# Patient Record
Sex: Female | Born: 1985 | Race: Black or African American | Hispanic: No | Marital: Single | Smoking: Never smoker
Health system: Southern US, Community
[De-identification: ages and names within clinical notes are randomized; demographics above are authoritative.]

## PROBLEM LIST (undated history)

## (undated) ENCOUNTER — Inpatient Hospital Stay (HOSPITAL_COMMUNITY): Payer: Self-pay

## (undated) DIAGNOSIS — A159 Respiratory tuberculosis unspecified: Secondary | ICD-10-CM

## (undated) DIAGNOSIS — B009 Herpesviral infection, unspecified: Secondary | ICD-10-CM

## (undated) DIAGNOSIS — Z7251 High risk heterosexual behavior: Secondary | ICD-10-CM

## (undated) HISTORY — DX: High risk heterosexual behavior: Z72.51

## (undated) HISTORY — PX: THERAPEUTIC ABORTION: SHX798

---

## 2013-04-23 ENCOUNTER — Emergency Department (HOSPITAL_COMMUNITY)
Admission: EM | Admit: 2013-04-23 | Discharge: 2013-04-23 | Disposition: A | Discharge status: Home / Self Care | Payer: Medicaid Other | Attending: Emergency Medicine | Admitting: Emergency Medicine

## 2013-04-23 ENCOUNTER — Emergency Department (HOSPITAL_COMMUNITY): Payer: Medicaid Other

## 2013-04-23 ENCOUNTER — Encounter (HOSPITAL_COMMUNITY): Payer: Self-pay | Admitting: Emergency Medicine

## 2013-04-23 DIAGNOSIS — K5900 Constipation, unspecified: Secondary | ICD-10-CM | POA: Insufficient documentation

## 2013-04-23 DIAGNOSIS — R109 Unspecified abdominal pain: Secondary | ICD-10-CM | POA: Insufficient documentation

## 2013-04-23 DIAGNOSIS — Z3202 Encounter for pregnancy test, result negative: Secondary | ICD-10-CM | POA: Insufficient documentation

## 2013-04-23 DIAGNOSIS — D649 Anemia, unspecified: Secondary | ICD-10-CM

## 2013-04-23 DIAGNOSIS — R110 Nausea: Secondary | ICD-10-CM | POA: Insufficient documentation

## 2013-04-23 DIAGNOSIS — Z79899 Other long term (current) drug therapy: Secondary | ICD-10-CM | POA: Insufficient documentation

## 2013-04-23 LAB — URINALYSIS, ROUTINE W REFLEX MICROSCOPIC
Bilirubin Urine: NEGATIVE
Glucose, UA: NEGATIVE mg/dL
Hgb urine dipstick: NEGATIVE
Ketones, ur: NEGATIVE mg/dL
Leukocytes, UA: NEGATIVE
Nitrite: NEGATIVE
Protein, ur: NEGATIVE mg/dL
Specific Gravity, Urine: 1.034 — ABNORMAL HIGH (ref 1.005–1.030)
Urobilinogen, UA: 1 mg/dL (ref 0.0–1.0)
pH: 6 (ref 5.0–8.0)

## 2013-04-23 LAB — COMPREHENSIVE METABOLIC PANEL
ALT: 8 U/L (ref 0–35)
AST: 13 U/L (ref 0–37)
Albumin: 3.7 g/dL (ref 3.5–5.2)
Alkaline Phosphatase: 45 U/L (ref 39–117)
BUN: 9 mg/dL (ref 6–23)
CO2: 25 mEq/L (ref 19–32)
Calcium: 8.6 mg/dL (ref 8.4–10.5)
Chloride: 103 mEq/L (ref 96–112)
Creatinine, Ser: 0.69 mg/dL (ref 0.50–1.10)
GFR calc Af Amer: >90 mL/min (ref >90)
GFR calc non Af Amer: >90 mL/min (ref >90)
Glucose, Bld: 91 mg/dL (ref 70–99)
Potassium: 3.6 mEq/L (ref 3.5–5.1)
Sodium: 137 mEq/L (ref 135–145)
Total Bilirubin: 0.2 mg/dL — ABNORMAL LOW (ref 0.3–1.2)
Total Protein: 6.7 g/dL (ref 6.0–8.3)

## 2013-04-23 LAB — LIPASE, BLOOD: Lipase: 15 U/L (ref 11–59)

## 2013-04-23 LAB — CBC
HCT: 29 % — ABNORMAL LOW (ref 36.0–46.0)
Hemoglobin: 10 g/dL — ABNORMAL LOW (ref 12.0–15.0)
MCH: 27.3 pg (ref 26.0–34.0)
MCHC: 34.5 g/dL (ref 30.0–36.0)
MCV: 79.2 fL (ref 78.0–100.0)
Platelets: 248 10*3/uL (ref 150–400)
RBC: 3.66 MIL/uL — ABNORMAL LOW (ref 3.87–5.11)
RDW: 14.3 % (ref 11.5–15.5)
WBC: 5.9 10*3/uL (ref 4.0–10.5)

## 2013-04-23 MED ORDER — POLYETHYLENE GLYCOL 3350 17 GM/SCOOP PO POWD: 17.0000 g | Freq: Two times a day (BID) | ORAL | Status: DC

## 2013-04-23 MED ORDER — IOHEXOL 300 MG/ML  SOLN
100.0000 mL | Freq: Once | INTRAMUSCULAR | Status: AC | PRN
  Administered 2013-04-23: 100 mL via INTRAVENOUS

## 2013-04-23 MED ORDER — IOHEXOL 300 MG/ML  SOLN
25.0000 mL | INTRAMUSCULAR | Status: AC
  Administered 2013-04-23: 25 mL via ORAL

## 2013-04-23 MED ORDER — MORPHINE SULFATE 4 MG/ML IJ SOLN
4.0000 mg | Freq: Once | INTRAMUSCULAR | Status: AC
  Administered 2013-04-23: 4 mg via INTRAVENOUS
  Filled 2013-04-23: qty 1

## 2013-04-23 NOTE — ED Notes (Signed)
Pt still working on PO contrast

## 2013-04-23 NOTE — ED Provider Notes (Signed)
History    CSN: [id number] Arrival date & time 04/23/13  4098  First MD Initiated Contact with Patient 04/23/13 [phone]     Chief Complaint  Patient presents with  . Abdominal Pain   (Consider location/radiation/quality/duration/timing/severity/associated sxs/prior Treatment) HPI Pt is a 27yo female c/o RUQ and and right flank pain that started 2-3 weeks ago, constant, waxes and wanes, 8/10.  Nothing makes pain better or worse, has tried OTC "pain reliever" w/o relief.  Also reports mild nausea with pain. Reports hx of similar episode of pain last year but it went away on its own.  Was never seen by medical provider for last episode.  Denies fever, vomiting, diarrhea, dysuria, hematuria, vaginal discharge, or pain.  Denies trauma to area. Denies hx of abdominal surgeries.  LMP 04/05/13, nl per pt.    History reviewed. No pertinent past medical history. Past Surgical History  Procedure Laterality Date  . Therapeutic abortion      Pt states that she has had 2 abortions.    History reviewed. No pertinent family history. History  Substance Use Topics  . Smoking status: Never Smoker   . Smokeless tobacco: Not on file  . Alcohol Use: Yes     Comment: Occassionally.    OB History   Grav Para Term Preterm Abortions TAB SAB Ect Mult Living                 Review of Systems  Constitutional: Negative for fever and chills.  Gastrointestinal: Positive for nausea and abdominal pain. Negative for vomiting, diarrhea and constipation.  Genitourinary: Negative for dysuria, hematuria, decreased urine volume, vaginal bleeding, vaginal discharge, vaginal pain and menstrual problem.  All other systems reviewed and are negative.    Allergies  Review of patient's allergies indicates no known allergies.  Home Medications   Current Outpatient Rx  Name  Route  Sig  Dispense  Refill  . polyethylene glycol powder (GLYCOLAX/MIRALAX) powder   Oral   Take 17 g by mouth 2 (two) times daily. Until  daily soft stools  OTC   255 g   0    BP 104/69  Pulse 75  Temp(Src) 97.9 F (36.6 C) (Oral)  Resp 16  Ht 5\' 7"  (1.702 m)  Wt 155 lb (70.308 kg)  BMI 24.27 kg/m2  SpO2 100%  LMP 04/05/2013 Physical Exam  Nursing note and vitals reviewed. Constitutional: She appears well-developed and well-nourished. No distress.  Pt lying on exam bed, appears in mild discomfort holding right side. NAD.  HENT:  Head: Normocephalic and atraumatic.  Eyes: Conjunctivae are normal. No scleral icterus.  Neck: Normal range of motion.  Cardiovascular: Normal rate, regular rhythm and normal heart sounds.   Pulmonary/Chest: Effort normal and breath sounds normal. No respiratory distress. She has no wheezes. She has no rales. She exhibits no tenderness.  Abdominal: Soft. Bowel sounds are normal. She exhibits no distension and no mass. There is tenderness ( mild, RUQ, right flank). There is no rebound and no guarding.  Abd soft, non-distended, mild TTP RUQ and right flank.  Musculoskeletal: Normal range of motion.  Neurological: She is alert.  Skin: Skin is warm and dry. She is not diaphoretic.    ED Course  Procedures (including critical care time) Labs Reviewed  URINALYSIS, ROUTINE W REFLEX MICROSCOPIC - Abnormal; Notable for the following:    Specific Gravity, Urine 1.034 (*)    All other components within normal limits  COMPREHENSIVE METABOLIC PANEL - Abnormal; Notable for the following:  Total Bilirubin 0.2 (*)    All other components within normal limits  CBC - Abnormal; Notable for the following:    RBC 3.66 (*)    Hemoglobin 10.0 (*)    HCT 29.0 (*)    All other components within normal limits  LIPASE, BLOOD  POCT PREGNANCY, URINE   Ct Abdomen Pelvis W Contrast  04/23/2013   *RADIOLOGY REPORT*  Clinical Data: Right lower quadrant pain  CT ABDOMEN AND PELVIS WITH CONTRAST  Technique:  Multidetector CT imaging of the abdomen and pelvis was performed following the standard protocol  during bolus administration of intravenous contrast.  Contrast: OMNIPAQUE IOHEXOL 300 MG/ML  SOLN  Comparison: None.  Findings: Sagittal images of the spine are unremarkable. Lung bases are unremarkable. Liver, spleen, pancreas and adrenals are unremarkable.  No aortic aneurysm.  No small bowel obstruction.  No ascites or free air.  No adenopathy.  Kidneys are symmetrical in size and enhancement.  No hydronephrosis or hydroureter.  Moderate stool noted in the right colon and proximal transverse colon.  There is no pericecal inflammation.  Normal appendix is clearly visualized in axial image 53.  Normal appendix is confirmed in the sagittal image 50.  There is a low-lying cecum.  There is a question of hemorrhagic follicle within the right ovary measures 1 cm.  Retroflexed uterus is noted.  No adnexal mass.  Probable trace free fluid posterior cul-de-sac. Urinary bladder is unremarkable.  No distal colonic obstruction.  IMPRESSION:  1.  No pericecal inflammation.  Normal appendix is clearly visualized. 2.  Moderate stool noted in the right colon and proximal transverse colon without evidence of colonic obstruction. 3.  No hydronephrosis or hydroureter. 4.  Retroflexed uterus. 5.  There is a question of hemorrhagic follicle within right ovary measures 1 cm.   Original Report Authenticated By: Natasha Mead, M.D.   1. Abdominal pain   2. Constipation   3. Anemia     MDM  DDx: biliary cholic, cholelithiasis, cholecystitis, renal stone, appendicitis. Labs: cbc, cmp, lipase, ua.  Imaging: bedside u/s, abd CT?  Bedside U/S: neg for stones.  Gallbladder appeared nl. Urine Preg: negative CBC: mild anemia, pt states she has been told in past she is anemic (ever since giving birth to her son 59yrs ago) CMP: unremarkable Lipase: WNL  CT abd: significant for moderate stool in right colon and proximal transverse colon w/o evidence of colonic obstruction   Rx: miralax. Will discharge pt home and have her f/u  with [facility] info provided. Return precautions given. Pt verbalized understanding and agreement with tx plan. Vitals: unremarkable. Discharged in stable condition.    Discussed pt with attending during ED encounter.   Junius Finner, PA-C 04/23/13 1625

## 2013-04-23 NOTE — ED Notes (Signed)
Dr. Radford Pax and Denny Peon at the bedside with the bedside u/s.

## 2013-04-23 NOTE — ED Notes (Signed)
Pt states that she has been having pain in her right upper and lower abdominal quadrant, that radiates to her right side. Pt states that she came in today because she can no longer sleep.

## 2013-04-23 NOTE — ED Notes (Signed)
Pt drinking oral conrast.

## 2013-04-23 NOTE — ED Notes (Signed)
Erin, PA at the bedside.  

## 2013-04-24 NOTE — ED Provider Notes (Signed)
Medical screening examination/treatment/procedure(s) were conducted as a shared visit with non-physician practitioner(s) and myself.  I personally evaluated the patient during the encounter   Venisha Boehning L Miaa Latterell, MD 04/24/13 1702 

## 2013-07-18 ENCOUNTER — Encounter (HOSPITAL_COMMUNITY): Payer: Self-pay | Admitting: *Deleted

## 2013-07-18 ENCOUNTER — Emergency Department (HOSPITAL_COMMUNITY)
Admission: EM | Admit: 2013-07-18 | Discharge: 2013-07-18 | Disposition: A | Discharge status: Home / Self Care | Payer: Medicaid Other | Attending: Emergency Medicine | Admitting: Emergency Medicine

## 2013-07-18 DIAGNOSIS — R197 Diarrhea, unspecified: Secondary | ICD-10-CM | POA: Insufficient documentation

## 2013-07-18 DIAGNOSIS — H9209 Otalgia, unspecified ear: Secondary | ICD-10-CM | POA: Insufficient documentation

## 2013-07-18 DIAGNOSIS — J020 Streptococcal pharyngitis: Secondary | ICD-10-CM

## 2013-07-18 DIAGNOSIS — R109 Unspecified abdominal pain: Secondary | ICD-10-CM | POA: Insufficient documentation

## 2013-07-18 DIAGNOSIS — R51 Headache: Secondary | ICD-10-CM | POA: Insufficient documentation

## 2013-07-18 DIAGNOSIS — R079 Chest pain, unspecified: Secondary | ICD-10-CM | POA: Insufficient documentation

## 2013-07-18 MED ORDER — IBUPROFEN 800 MG PO TABS: 800.0000 mg | ORAL_TABLET | Freq: Three times a day (TID) | ORAL | Status: DC

## 2013-07-18 MED ORDER — IBUPROFEN 400 MG PO TABS
800.0000 mg | ORAL_TABLET | Freq: Once | ORAL | Status: AC
  Administered 2013-07-18: 800 mg via ORAL
  Filled 2013-07-18: qty 2

## 2013-07-18 MED ORDER — PENICILLIN G BENZATHINE 1200000 UNIT/2ML IM SUSP
1.2000 10*6.[IU] | Freq: Once | INTRAMUSCULAR | Status: AC
  Administered 2013-07-18: 1.2 10*6.[IU] via INTRAMUSCULAR
  Filled 2013-07-18: qty 2

## 2013-07-18 MED ORDER — HYDROCODONE-ACETAMINOPHEN 5-325 MG PO TABS: 1.0000 | ORAL_TABLET | Freq: Four times a day (QID) | ORAL | Status: DC | PRN

## 2013-07-18 MED ORDER — HYDROCODONE-ACETAMINOPHEN 5-325 MG PO TABS
1.0000 | ORAL_TABLET | Freq: Once | ORAL | Status: AC
  Administered 2013-07-18: 1 via ORAL
  Filled 2013-07-18: qty 1

## 2013-07-18 NOTE — ED Provider Notes (Signed)
CSN: [id number]     Arrival date & time 07/18/13  0907 History  This chart was scribed for non-physician practitioner Jaynie Crumble, PA-C working with Suzi Roots, MD by Valera Castle, ED scribe. This patient was seen in room TR11C/TR11C and the patient's care was started at 10:38 AM.    Chief Complaint  Patient presents with  . URI    Patient is a 27 y.o. female presenting with URI. The history is provided by the patient. No language interpreter was used.  URI Presenting symptoms: congestion, ear pain (Bilateral.) and sore throat   Presenting symptoms: no cough and no fever   Associated symptoms: headaches    HPI Comments: Debbie Greene is a 27 y.o. female who presents to the Emergency Department complaining of sudden, moderate, constant URI, onset 3 days ago, with associated congestion, sore throat, ear pain, headache, chills, hot flashes, abdominal pain, diarrhea, and chest soreness. She reports  She states that the left side of her face has felt swollen.She reports having tried Theraflu, with no relief. She denies cough, dysuria, and any other associated symptoms. She denies smoking, but reports occasional EtOH use. She has no allergies and denies any medical history.   History reviewed. No pertinent past medical history. Past Surgical History  Procedure Laterality Date  . Therapeutic abortion      Pt states that she has had 2 abortions.    No family history on file. History  Substance Use Topics  . Smoking status: Never Smoker   . Smokeless tobacco: Not on file  . Alcohol Use: Yes     Comment: Occassionally.    OB History   Grav Para Term Preterm Abortions TAB SAB Ect Mult Living                 Review of Systems  Constitutional: Positive for chills. Negative for fever.  HENT: Positive for ear pain (Bilateral.), congestion and sore throat.   Respiratory: Negative for cough.   Cardiovascular: Positive for chest pain.  Gastrointestinal: Positive for abdominal  pain and diarrhea.  Genitourinary: Negative for dysuria.  Neurological: Positive for headaches.  All other systems reviewed and are negative.    Allergies  Review of patient's allergies indicates no known allergies.  Home Medications   Current Outpatient Rx  Name  Route  Sig  Dispense  Refill  . polyethylene glycol powder (GLYCOLAX/MIRALAX) powder   Oral   Take 17 g by mouth 2 (two) times daily. Until daily soft stools  OTC   255 g   0    Triage Vitals: BP 100/73  Pulse 117  Temp(Src) 99.7 F (37.6 C) (Oral)  Resp 22  Ht 5\' 7"  (1.702 m)  Wt 155 lb (70.308 kg)  BMI 24.27 kg/m2  SpO2 96%  LMP 07/13/2013  Physical Exam  Nursing note and vitals reviewed. Constitutional: She is oriented to person, place, and time. She appears well-developed and well-nourished. No distress.  HENT:  Head: Normocephalic and atraumatic.  Right Ear: External ear normal.  Left Ear: External ear normal.  Clear rhinorrhea, nasal congestion. Tonsils bilaterally enlarged with exudate. Uvula is midline. Left submandibular tenderness with lymphadenopathy.   Eyes: EOM are normal.  Neck: Neck supple. No tracheal deviation present.  Cardiovascular: Normal rate.   Pulmonary/Chest: Effort normal and breath sounds normal. No respiratory distress.  Musculoskeletal: Normal range of motion.  Neurological: She is alert and oriented to person, place, and time.  Skin: Skin is warm and dry.  Psychiatric: She has  a normal mood and affect. Her behavior is normal.    ED Course  Procedures (including critical care time)  DIAGNOSTIC STUDIES: Oxygen Saturation is 96% on room air, normal by my interpretation.    COORDINATION OF CARE: 10:43 AM-Discussed treatment plan which includes an EKG 12-Lead, rapid strep screen, Hydrocodone, and Ibuprofen with pt at bedside and pt agreed to plan. Discussed clinical suspicion of an abscess and will give pt referral to ENT doctor.    .  Labs Review Labs Reviewed   RAPID STREP SCREEN - Abnormal; Notable for the following:    Streptococcus, Group A Screen (Direct) POSITIVE (*)    All other components within normal limits   Imaging Review No results found.  MDM   1. Strep pharyngitis    Patient with bilaterally swollen and enlarged tonsils, left just slightly greater than right. The uvula is midline, she's speaking normally. She is febrile, tachycardic. The strap back and is positive. Patient chose to take a bicillin injection in emergency department and was given 1.2 million units. We'll discharge her home with Norco for pain, ibuprofen for fever. She will followup with her primary care Dr. or your dentist or doctor if she continues to have increased swelling on one tonsil only. I did talk to Dr. Wynelle Link regarding her pain primarily being on one side and her left tonsil being slightly larger than the right, however he agreed that at this time there is no signs of a tonsillar abscess, her uvula is midline, she's not having difficulty swallowing or breathing. She was given instructions and discharged home in stable condition.   Filed Vitals:   07/18/13 0915 07/18/13 1218  BP: 100/73 105/74  Pulse: 117 102  Temp: 99.7 F (37.6 C) 98.4 F (36.9 C)  TempSrc: Oral Oral  Resp: 22 20  Height: 5\' 7"  (1.702 m)   Weight: 155 lb (70.308 kg)   SpO2: 96% 99%      Lottie Mussel, PA-C 07/18/13 1636

## 2013-07-18 NOTE — ED Notes (Signed)
Pt states that "the left side of my face is feeling swollen", pt sounds congested and has a sore throat.  Pt states that she has had headache, chills and hot flashes with this as well as soreness in her chest all since Sunday.

## 2013-07-23 NOTE — ED Provider Notes (Signed)
Medical screening examination/treatment/procedure(s) were conducted as a shared visit with non-physician practitioner(s) and myself.  I personally evaluated the patient during the encounter Pt c/o sore throat, bilateral, dull pain. No trouble breathing or swallowing. Uvula midline, pharynx erythematous, no abscess noted.   Suzi Roots, MD 07/23/13 [phone]

## 2014-02-08 ENCOUNTER — Encounter (HOSPITAL_COMMUNITY): Payer: Self-pay | Admitting: Emergency Medicine

## 2014-02-08 ENCOUNTER — Emergency Department (HOSPITAL_COMMUNITY)
Admission: EM | Admit: 2014-02-08 | Discharge: 2014-02-08 | Disposition: A | Discharge status: Home / Self Care | Payer: Medicaid Other | Attending: Emergency Medicine | Admitting: Emergency Medicine

## 2014-02-08 ENCOUNTER — Emergency Department (HOSPITAL_COMMUNITY): Payer: Medicaid Other

## 2014-02-08 DIAGNOSIS — R110 Nausea: Secondary | ICD-10-CM | POA: Insufficient documentation

## 2014-02-08 DIAGNOSIS — R109 Unspecified abdominal pain: Secondary | ICD-10-CM

## 2014-02-08 DIAGNOSIS — R1011 Right upper quadrant pain: Secondary | ICD-10-CM | POA: Insufficient documentation

## 2014-02-08 LAB — CBC WITH DIFFERENTIAL/PLATELET
Basophils Absolute: 0 10*3/uL (ref 0.0–0.1)
Basophils Relative: 0 % (ref 0–1)
Eosinophils Absolute: 0.1 10*3/uL (ref 0.0–0.7)
Eosinophils Relative: 1 % (ref 0–5)
HCT: 29.3 % — ABNORMAL LOW (ref 36.0–46.0)
Hemoglobin: 9.4 g/dL — ABNORMAL LOW (ref 12.0–15.0)
Lymphocytes Relative: 22 % (ref 12–46)
Lymphs Abs: 1.1 10*3/uL (ref 0.7–4.0)
MCH: 24.7 pg — ABNORMAL LOW (ref 26.0–34.0)
MCHC: 32.1 g/dL (ref 30.0–36.0)
MCV: 77.1 fL — ABNORMAL LOW (ref 78.0–100.0)
Monocytes Absolute: 0.5 10*3/uL (ref 0.1–1.0)
Monocytes Relative: 9 % (ref 3–12)
Neutro Abs: 3.4 10*3/uL (ref 1.7–7.7)
Neutrophils Relative %: 68 % (ref 43–77)
Platelets: 255 10*3/uL (ref 150–400)
RBC: 3.8 MIL/uL — ABNORMAL LOW (ref 3.87–5.11)
RDW: 14.9 % (ref 11.5–15.5)
WBC: 5 10*3/uL (ref 4.0–10.5)

## 2014-02-08 LAB — COMPREHENSIVE METABOLIC PANEL
ALT: 8 U/L (ref 0–35)
AST: 14 U/L (ref 0–37)
Albumin: 3.9 g/dL (ref 3.5–5.2)
Alkaline Phosphatase: 44 U/L (ref 39–117)
BUN: 13 mg/dL (ref 6–23)
CO2: 25 mEq/L (ref 19–32)
Calcium: 8.9 mg/dL (ref 8.4–10.5)
Chloride: 103 mEq/L (ref 96–112)
Creatinine, Ser: 0.79 mg/dL (ref 0.50–1.10)
GFR calc Af Amer: >90 mL/min (ref >90)
GFR calc non Af Amer: >90 mL/min (ref >90)
Glucose, Bld: 92 mg/dL (ref 70–99)
Potassium: 4 mEq/L (ref 3.7–5.3)
Sodium: 139 mEq/L (ref 137–147)
Total Bilirubin: 0.2 mg/dL — ABNORMAL LOW (ref 0.3–1.2)
Total Protein: 7.1 g/dL (ref 6.0–8.3)

## 2014-02-08 LAB — LIPASE, BLOOD: Lipase: 13 U/L (ref 11–59)

## 2014-02-08 MED ORDER — FAMOTIDINE 20 MG PO TABS: 20.0000 mg | ORAL_TABLET | Freq: Two times a day (BID) | ORAL | Status: DC

## 2014-02-08 NOTE — ED Notes (Signed)
Pt to ED c/o R sided abdominal pain since 0400. Pt reports hx of same x 2 years, has been told "pain is related to bowel movements." Pain is intermittent; pressure makes pain better. Denies constipation, diarrhea, or urinary issues.

## 2014-02-08 NOTE — ED Notes (Signed)
Dr. Kohut at bedside 

## 2014-02-08 NOTE — ED Provider Notes (Signed)
CSN: [id number]     Arrival date & time 02/08/14  [id number] History   First MD Initiated Contact with Patient 02/08/14 [phone]     Chief Complaint  Patient presents with  . Abdominal Pain     (Consider location/radiation/quality/duration/timing/severity/associated sxs/prior Treatment) HPI  28 year old female with right upper quadrant pain. Intermittent. This has been ongoing over the past 2 years or so. Worse in the past couple days which is why she presented this morning. She hasn't noticed any particular exacerbating or relieving factors. Not clearly postprandial. Not any worse at night. No weight loss. No anorexia. No bloody or melanotic stools. No urinary complaints. Sometimes some mild nausea but she only related this when specifically asked. No history of any abdominal surgery. Has regular bowel movements. No significant relief after. She sometimes will feel better with putting manual pressure in this area such as holding a pillow against it. Non smoker. Occasional ETOH.   History reviewed. No pertinent past medical history. Past Surgical History  Procedure Laterality Date  . Therapeutic abortion      Pt states that she has had 2 abortions.    History reviewed. No pertinent family history. History  Substance Use Topics  . Smoking status: Never Smoker   . Smokeless tobacco: Not on file  . Alcohol Use: Yes     Comment: Occassionally.    OB History   Grav Para Term Preterm Abortions TAB SAB Ect Mult Living                 Review of Systems  All systems reviewed and negative, other than as noted in HPI.   Allergies  Review of patient's allergies indicates no known allergies.  Home Medications   Prior to Admission medications   Medication Sig Start Date End Date Taking? Authorizing Provider  acetaminophen (TYLENOL) 500 MG tablet Take 1,000 mg by mouth every 6 (six) hours as needed for mild pain.   Yes Historical Provider, MD   BP 111/82  Pulse 82  Temp(Src) 97.4 F (36.3 C)  (Oral)  Resp 16  SpO2 99%  LMP 01/16/2014 Physical Exam  Nursing note and vitals reviewed. Constitutional: She appears well-developed and well-nourished. No distress.  HENT:  Head: Normocephalic and atraumatic.  Eyes: Conjunctivae are normal. Right eye exhibits no discharge. Left eye exhibits no discharge.  Neck: Neck supple.  Cardiovascular: Normal rate, regular rhythm and normal heart sounds.  Exam reveals no gallop and no friction rub.   No murmur heard. Pulmonary/Chest: Effort normal and breath sounds normal. No respiratory distress.  Abdominal: Soft. She exhibits no distension.  Mild tenderness in RUQ. Soft. No rebound or mass.   Genitourinary:  No cva tenderness  Musculoskeletal: She exhibits no edema and no tenderness.  Neurological: She is alert.  Skin: Skin is warm and dry.  Psychiatric: She has a normal mood and affect. Her behavior is normal. Thought content normal.    ED Course  Procedures (including critical care time) Labs Review Labs Reviewed  COMPREHENSIVE METABOLIC PANEL - Abnormal; Notable for the following:    Total Bilirubin 0.2 (*)    All other components within normal limits  CBC WITH DIFFERENTIAL - Abnormal; Notable for the following:    RBC 3.80 (*)    Hemoglobin 9.4 (*)    HCT 29.3 (*)    MCV 77.1 (*)    MCH 24.7 (*)    All other components within normal limits  LIPASE, BLOOD    Imaging Review No results found.  [place] Abdomen Limited  02/08/2014   CLINICAL DATA:  Right upper quadrant pain  EXAM: US ABDOMEN LIMITED - RIGHT UPPER QUADRANT  COMPARISON:  None.  FINDINGS: Gallbladder:  No gallstones or wall thickening visualized. No sonographic Murphy sign noted.  Common bile duct:  Diameter: 2.5 mil  Liver:  No focal lesion identified. Within normal limits in parenchymal echogenicity.  IMPRESSION: No acute abnormality noted.   Electronically Signed   By: Alcide CleverMark  Lukens M.D.   On: 02/08/2014 09:47    EKG Interpretation None      MDM   Final  diagnoses:  Right sided abdominal pain    28 year old female with intermittent right upper quadrant pain. This has been ongoing for approximately the past 2 years. She's been previously evaluated for the same, although not recently. Previous workup included a CT of the abdomen and pelvis which did not show any obvious explanatory pathology. Previous note mentions normal bedside ultrasound. Her abdominal exam is pretty unremarkable. Perhaps some tenderness in her right upper quadrant. Given the chronicity of her complaint and her exam, I have a very low suspicion for serious pathology. This may potentially be biliary colic given the intermittent nature and the location, although her symptoms aren't classic. Will obtain a formal right upper quadrant ultrasound. CMP and lipase. Patient offered pain medicines, but she declined. If w/u today is again unrevealing, then routine outpatient surgical or GI followup if symptoms persist. Will give a trial of H2 blocker. Not convinced GERD or PUD, but I think it's reasonable to try.     Raeford RazorStephen Daaron Dimarco, MD 02/12/14 1450

## 2014-02-08 NOTE — Discharge Instructions (Signed)
Abdominal Pain, Adult °Many things can cause abdominal pain. Usually, abdominal pain is not caused by a disease and will improve without treatment. It can often be observed and treated at home. Your health care provider will do a physical exam and possibly order blood tests and X-rays to help determine the seriousness of your pain. However, in many cases, more time must pass before a clear cause of the pain can be found. Before that point, your health care provider may not know if you need more testing or further treatment. °HOME CARE INSTRUCTIONS  °Monitor your abdominal pain for any changes. The following actions may help to alleviate any discomfort you are experiencing: °· Only take over-the-counter or prescription medicines as directed by your health care provider. °· Do not take laxatives unless directed to do so by your health care provider. °· Try a clear liquid diet (broth, tea, or water) as directed by your health care provider. Slowly move to a bland diet as tolerated. °SEEK MEDICAL CARE IF: °· You have unexplained abdominal pain. °· You have abdominal pain associated with nausea or diarrhea. °· You have pain when you urinate or have a bowel movement. °· You experience abdominal pain that wakes you in the night. °· You have abdominal pain that is worsened or improved by eating food. °· You have abdominal pain that is worsened with eating fatty foods. °SEEK IMMEDIATE MEDICAL CARE IF:  °· Your pain does not go away within 2 hours. °· You have a fever. °· You keep throwing up (vomiting). °· Your pain is felt only in portions of the abdomen, such as the right side or the left lower portion of the abdomen. °· You pass bloody or black tarry stools. °MAKE SURE YOU: °· Understand these instructions.   °· Will watch your condition.   °· Will get help right away if you are not doing well or get worse.   °Document Released: 07/14/2005 Document Revised: 07/25/2013 Document Reviewed: 06/13/2013 °ExitCare® Patient  Information ©2014 [employer]. °  Emergency Department Resource Guide °1) Find a Doctor and Pay Out of Pocket °Although you won't have to find out who is covered by your insurance plan, it is a good idea to ask around and get recommendations. You will then need to call the office and see if the doctor you have chosen will accept you as a new patient and what types of options they offer for patients who are self-pay. Some doctors offer discounts or will set up payment plans for their patients who do not have insurance, but you will need to ask so you aren't surprised when you get to your appointment. ° °2) Contact Your Local Health Department °Not all health departments have doctors that can see patients for sick visits, but many do, so it is worth a call to see if yours does. If you don't know where your local health department is, you can check in your phone book. The CDC also has a tool to help you locate your state's health department, and many state websites also have listings of all of their local health departments. ° °3) Find a Walk-in Clinic °If your illness is not likely to be very severe or complicated, you may want to try a walk in clinic. These are popping up all over the country in pharmacies, drugstores, and shopping centers. They're usually staffed by nurse practitioners or physician assistants that have been trained to treat common illnesses and complaints. They're usually fairly quick and inexpensive. However, if you have   serious medical issues or chronic medical problems, these are probably not your best option. ° °No Primary Care Doctor: °- Call [facility] at  832-8000 - they can help you locate a primary care doctor that  accepts your insurance, provides certain services, etc. °- Physician Referral Service- 1-800-533-3463 ° °Chronic Pain Problems: °Organization         Address  Phone   Notes  °[facility]  (336) 297-2271 Patients need to be referred by their primary care  doctor.  ° °Medication Assistance: °Organization         Address  Phone   Notes  °[place] [facility] [address] °[place], [place] [address] (336) 641-8030 --Must be a resident of [place] °-- Must have NO insurance coverage whatsoever (no Medicaid/ Medicare, etc.) °-- The pt. MUST have a primary care doctor that directs their care regularly and follows them in the community °  °[facility]  (866) 331-1348   °[employer]  (888) 892-1162   ° °Agencies that provide inexpensive medical care: °Organization         Address  Phone   Notes  °[facility]  (336) 832-8035   °[facility]    (336) 832-7272   °[facility] [address] °[place], [place] [address] (336) 832-4777   °[facility] [address], °[place] (336) 271-4999   °[facility]    (336) 373-0678   °[facility]    (336) 272-1050   °[facility] ° [address], [place] Phone:  (336) 832-4444, Fax:  (336) 832-4440 Hours of Operation:  9 am - 6 pm, M-F.  Also accepts Medicaid/Medicare and self-pay.  °[facility] ° [address], [place] Phone: (336) 832-3150, Fax: (336) 832-3151. Hours of Operation:  8:30 am - 5:30 pm, M-F.  Also accepts Medicaid and self-pay.  °[facility] [place] [address], [place] Phone: (336) 878-6027   °[facility] [address], [place], [place] (336)723-1848, Ext. 123 Mondays & Thursdays: 7-9 AM.  First 15 patients are seen on a first come, first serve basis. °  ° °Medicaid-accepting [place] Providers: ° °Organization         Address  Phone   Notes  °[facility] [address], [place] (336) 641-2100 Also accepts self-pay patients.  °[facility] [address], [place] ° (336) 856-9996   °[facility] [address], [place] (336) 288-8857   °[facility] [address] [place] [address], [place] (336) 299-7000   °Veita Bland [address], [place]  ° (336) 373-1557 Only accepts [place] Access Medicaid patients after they have their name applied to their card.  ° °Self-Pay (no insurance) in [place]: ° °Organization         Address  Phone   Notes  °Sickle Cell Patients, [facility] [address], [place] (336) 832-1970   °[facility] [address], [place] (336) 832-4400   °[facility] ° [address], [place] (336) 992-4800   °[facility]/Dr. Osei-Bonsu ° [address] [place] [address], [place] or [address], [place] (336) 841-8500 Phone number for both [place] and [place] locations   is the same.  °[facility] [address], [place] (336) 299-0000   °[facility] [address] [place] [address], [place] or [address] (336) 852-7530 °(336) 878-2260   °[facility] [address], [place] (336) 350-1642, phone; (336) 294-5005, fax Sees patients 1st and 3rd Saturday of every month.  Must not qualify for public or private insurance (i.e. Medicaid, Medicare, [place] Health Choice, Veterans' Benefits) • Household income should be no more than 200% of the poverty level •The clinic cannot treat you if you are pregnant or think you are pregnant • Sexually transmitted diseases are not treated at the clinic.  ° °Dental Care: °Organization         Address  Phone  Notes  °[facility] [address], [place] (336) 641-6152 Accepts children up to age 21 who are enrolled in Medicaid or [place] Health Choice; pregnant women with a Medicaid card; and children who have applied for Medicaid or [place] Health Choice, but were declined, whose parents can pay a reduced fee at time of  service.  °[facility] [place]  [address], [place] (336) 641-7733 Accepts children up to age 21 who are enrolled in Medicaid or [place] Health Choice; pregnant women with a Medicaid card; and children who have applied for Medicaid or [place] Health Choice, but were declined, whose parents can pay a reduced fee at time of service.  °[facility] ° [address], [place] (336) 641-4533 Patients are seen by appointment only. Walk-ins are not accepted. [facility] will see patients 18 years of age and older. °Monday - Tuesday (8am-5pm) °Most Wednesdays (8:30-5pm) °$30 per visit, cash only  °[facility] ° [address], [place] (336) 641-4533 Patients are seen by appointment only. Walk-ins are not accepted. [facility] will see patients 18 years of age and older. °One Wednesday Evening (Monthly: Volunteer Based).  $30 per visit, cash only  °[facility]  (919) 537-3737 for adults; Children under age 4, call Graduate Pediatric Dentistry at ([phone]) [phone]. Children aged 4-14, please call (919) [phone] to request a pediatric application. ° Dental services are provided in all areas of dental care including fillings, crowns and bridges, complete and partial dentures, implants, gum treatment, root canals, and extractions. Preventive care is also provided. Treatment is provided to both adults and children. °Patients are selected via a lottery and there is often a waiting list. °  °[facility] [address], °[place] ° (336) 763-8833 [email or web address] °  °[facility] [address], [place], [place] (336)723-1848, Ext. 123 Second and Fourth Thursday of each month, opens at 6:30 AM; Clinic ends at 9 AM.  Patients are seen on a first-come first-served basis, and a limited number are seen during each clinic.  ° °[facility] ° [address], [place],  [place] (336) 723-7904   Eligibility Requirements °You must have lived in [place], [place], or [place] counties for at least the last three months. °  You cannot be eligible for state or federal sponsored healthcare insurance, including Veterans Administration, Medicaid, or Medicare. °  You generally cannot be eligible for healthcare insurance through your employer.  °  How to apply: °Eligibility screenings are held every Tuesday and Wednesday afternoon from 1:00 pm until 4:00 pm. You do not need an appointment   for the interview!  °[facility] [address], [place], [place] 336-631-2330   °[facility]  336-342-8273   °[facility]  336-703-3100   °[facility]  336-570-6415   ° °Behavioral Health Resources in the Community: °Intensive Outpatient Programs °Organization         Address  Phone  Notes  °[facility] [address], [place], [place] 336-878-6098   °[facility] [address], [place], [place] 336-832-9800   °[facility] [address], [place], [place] ° 336-882-2125   °[facility] [address],  °[place], [place] 1-800-853-5163 or 336-641-4981   °Substance Abuse Resources °Organization         Address  Phone  Notes  °[facility]  336-882-2125   °[facility]  336-784-9470   °[facility]  336-285-9073   °[facility]  336-845-3988   °Residential & Outpatient Substance Abuse Program  1-800-659-3381   °Psychological Services °Organization         Address  Phone  Notes  °[facility]  336- 832-9600   °[facility]  336- 378-7881   °[facility] [address], [place] 1-800-853-5163 or 336-641-4981   ° °Mobile Crisis Teams °Organization         Address  Phone  Notes  °[facility], Mobile Crisis Care Unit  1-877-626-1772   °Assertive °[facility] ° [address] [place], [place] 336-834-9664   °Sharon DeEsch [address] °[place] 336-554-5454   ° °Self-Help/Support Groups °Organization         Address  Phone             Notes  °[facility]  [place] - variety of support groups  336- 373-1402 Call for more information  °Narcotics Anonymous (NA), [facility] [address], °[place]  2 meetings at this location  ° °Residential Treatment Programs °Organization         Address  Phone  Notes  °[facility] [address],    °[place]  1-866-801-8205   °[facility] ° [address], [place], [place] 704-293-8524   °[facility] [address], [place] 336-845-3988 Admissions: 8am-3pm M-F  °[facility] [address]    °[place], [place] 336-841-1104   °[facility] [address], [place], [place] 336-379-7146   °[facility] [address]  °[place], [place] 336-285-9073   °[facility] Intensive Outpatient [address], [place], [place] 336-852-3033   °[facility] [address]  °[place], [place] 1-877-615-2722 or 336-784-9470   °[facility] [address] [place], [place] 336-227-7417 Accepts Medicaid  °[facility] [address]  ° [place] 1-800-659-3381 Substance Abuse/Addiction Treatment  ° °[place] Behavioral Health Resources °Organization         Address  Phone  Notes  °[facility]  (888) 581-9988   °Julie Brannon, PhD [address] [place], [place]   (336) 349-5553 or (336) 951-0000   °[facility]   [address] °[place], [place] (336) 349-4454   °[facility] [address], [place],  (336) 342-8316 Insurance/Medicaid/sponsorship through [facility]  °[facility] [address]                                      [place], [place] (336) 342-8316  Therapy/tele-psych/case  °[facility] [address]  ° [place], [place] (336) 349-2233    °Dr. Arfeen  (336) 349-4544   °[facility] [place]  [employer] [place] [facility] 1) [address], [place] °2) [address], [place] °3)  [address], [place] (336) 349-3220 °(336) 342-7768 ° °(336) 342-8140   °[place] [facility] (336) 342-1394 or (336) 342-3537 (After Hours)    ° °   °

## 2015-02-15 ENCOUNTER — Emergency Department (HOSPITAL_COMMUNITY)
Admission: EM | Admit: 2015-02-15 | Discharge: 2015-02-15 | Disposition: A | Discharge status: Home / Self Care | Payer: Medicaid Other | Attending: Emergency Medicine | Admitting: Emergency Medicine

## 2015-02-15 ENCOUNTER — Emergency Department (HOSPITAL_COMMUNITY): Payer: Medicaid Other

## 2015-02-15 ENCOUNTER — Encounter (HOSPITAL_COMMUNITY): Payer: Self-pay | Admitting: Emergency Medicine

## 2015-02-15 DIAGNOSIS — Z3202 Encounter for pregnancy test, result negative: Secondary | ICD-10-CM | POA: Insufficient documentation

## 2015-02-15 DIAGNOSIS — R1033 Periumbilical pain: Secondary | ICD-10-CM | POA: Insufficient documentation

## 2015-02-15 DIAGNOSIS — R0789 Other chest pain: Secondary | ICD-10-CM

## 2015-02-15 DIAGNOSIS — Z79899 Other long term (current) drug therapy: Secondary | ICD-10-CM | POA: Insufficient documentation

## 2015-02-15 DIAGNOSIS — G8929 Other chronic pain: Secondary | ICD-10-CM

## 2015-02-15 DIAGNOSIS — R109 Unspecified abdominal pain: Secondary | ICD-10-CM

## 2015-02-15 LAB — BASIC METABOLIC PANEL
ANION GAP: 9 (ref 5–15)
BUN: 10 mg/dL (ref 6–23)
CO2: 23 mmol/L (ref 19–32)
Calcium: 9.2 mg/dL (ref 8.4–10.5)
Chloride: 106 mmol/L (ref 96–112)
Creatinine, Ser: 0.73 mg/dL (ref 0.50–1.10)
Glucose, Bld: 86 mg/dL (ref 70–99)
POTASSIUM: 3.6 mmol/L (ref 3.5–5.1)
Sodium: 138 mmol/L (ref 135–145)

## 2015-02-15 LAB — CBC
HCT: 31.2 % — ABNORMAL LOW (ref 36.0–46.0)
HEMOGLOBIN: 10.1 g/dL — AB (ref 12.0–15.0)
MCH: 25.6 pg — ABNORMAL LOW (ref 26.0–34.0)
MCHC: 32.4 g/dL (ref 30.0–36.0)
MCV: 79 fL (ref 78.0–100.0)
PLATELETS: 272 10*3/uL (ref 150–400)
RBC: 3.95 MIL/uL (ref 3.87–5.11)
RDW: 14.3 % (ref 11.5–15.5)
WBC: 6.1 10*3/uL (ref 4.0–10.5)

## 2015-02-15 LAB — URINALYSIS, ROUTINE W REFLEX MICROSCOPIC
Bilirubin Urine: NEGATIVE
Glucose, UA: NEGATIVE mg/dL
Hgb urine dipstick: NEGATIVE
Ketones, ur: NEGATIVE mg/dL
LEUKOCYTES UA: NEGATIVE
Nitrite: NEGATIVE
Protein, ur: NEGATIVE mg/dL
SPECIFIC GRAVITY, URINE: 1.027 (ref 1.005–1.030)
UROBILINOGEN UA: 0.2 mg/dL (ref 0.0–1.0)
pH: 5.5 (ref 5.0–8.0)

## 2015-02-15 LAB — POC URINE PREG, ED: PREG TEST UR: NEGATIVE

## 2015-02-15 LAB — I-STAT TROPONIN, ED: Troponin i, poc: 0 ng/mL (ref 0.00–0.08)

## 2015-02-15 MED ORDER — NAPROXEN 500 MG PO TABS: 500.0000 mg | ORAL_TABLET | Freq: Two times a day (BID) | ORAL | Status: DC

## 2015-02-15 MED ORDER — KETOROLAC TROMETHAMINE 30 MG/ML IJ SOLN
30.0000 mg | Freq: Once | INTRAMUSCULAR | Status: DC
  Filled 2015-02-15: qty 1

## 2015-02-15 MED ORDER — KETOROLAC TROMETHAMINE 30 MG/ML IJ SOLN
30.0000 mg | Freq: Once | INTRAMUSCULAR | Status: AC
  Administered 2015-02-15: 30 mg via INTRAMUSCULAR

## 2015-02-15 NOTE — ED Notes (Signed)
Pt arrives from home with c/o of mid chest pain radiating to R back. States its been on anf off for three years, but tonight it hasn't gone away. States she isn't able to sleep with it.

## 2015-02-15 NOTE — Discharge Instructions (Signed)
Chest Pain (Nonspecific) °It is often hard to give a specific diagnosis for the cause of chest pain. There is always a chance that your pain could be related to something serious, such as a heart attack or a blood clot in the lungs. You need to follow up with your health care provider for further evaluation. °CAUSES  °· Heartburn. °· Pneumonia or bronchitis. °· Anxiety or stress. °· Inflammation around your heart (pericarditis) or lung (pleuritis or pleurisy). °· A blood clot in the lung. °· A collapsed lung (pneumothorax). It can develop suddenly on its own (spontaneous pneumothorax) or from trauma to the chest. °· Shingles infection (herpes zoster virus). °The chest wall is composed of bones, muscles, and cartilage. Any of these can be the source of the pain. °· The bones can be bruised by injury. °· The muscles or cartilage can be strained by coughing or overwork. °· The cartilage can be affected by inflammation and become sore (costochondritis). °DIAGNOSIS  °Lab tests or other studies may be needed to find the cause of your pain. Your health care provider may have you take a test called an ambulatory electrocardiogram (ECG). An ECG records your heartbeat patterns over a 24-hour period. You may also have other tests, such as: °· Transthoracic echocardiogram (TTE). During echocardiography, sound waves are used to evaluate how blood flows through your heart. °· Transesophageal echocardiogram (TEE). °· Cardiac monitoring. This allows your health care provider to monitor your heart rate and rhythm in real time. °· Holter monitor. This is a portable device that records your heartbeat and can help diagnose heart arrhythmias. It allows your health care provider to track your heart activity for several days, if needed. °· Stress tests by exercise or by giving medicine that makes the heart beat faster. °TREATMENT  °· Treatment depends on what may be causing your chest pain. Treatment may include: °· Acid blockers for  heartburn. °· Anti-inflammatory medicine. °· Pain medicine for inflammatory conditions. °· Antibiotics if an infection is present. °· You may be advised to change lifestyle habits. This includes stopping smoking and avoiding alcohol, caffeine, and chocolate. °· You may be advised to keep your head raised (elevated) when sleeping. This reduces the chance of acid going backward from your stomach into your esophagus. °Most of the time, nonspecific chest pain will improve within 2-3 days with rest and mild pain medicine.  °HOME CARE INSTRUCTIONS  °· If antibiotics were prescribed, take them as directed. Finish them even if you start to feel better. °· For the next few days, avoid physical activities that bring on chest pain. Continue physical activities as directed. °· Do not use any tobacco products, including cigarettes, chewing tobacco, or electronic cigarettes. °· Avoid drinking alcohol. °· Only take medicine as directed by your health care provider. °· Follow your health care provider's suggestions for further testing if your chest pain does not go away. °· Keep any follow-up appointments you made. If you do not go to an appointment, you could develop lasting (chronic) problems with pain. If there is any problem keeping an appointment, call to reschedule. °SEEK MEDICAL CARE IF:  °· Your chest pain does not go away, even after treatment. °· You have a rash with blisters on your chest. °· You have a fever. °SEEK IMMEDIATE MEDICAL CARE IF:  °· You have increased chest pain or pain that spreads to your arm, neck, jaw, back, or abdomen. °· You have shortness of breath. °· You have an increasing cough, or you cough   up blood.  You have severe back or abdominal pain.  You feel nauseous or vomit.  You have severe weakness.  You faint.  You have chills. This is an emergency. Do not wait to see if the pain will go away. Get medical help at once. Call your local emergency services (911 in [place] Do not drive  yourself to the hospital. MAKE SURE YOU:   Understand these instructions.  Will watch your condition.  Will get help right away if you are not doing well or get worse. Document Released: 07/14/2005 Document Revised: 10/09/2013 Document Reviewed: 05/09/2008 [facility] Patient Information 2015 [place], [place]. This information is not intended to replace advice given to you by your health care provider. Make sure you discuss any questions you have with your health care provider.  Biliary Colic  Biliary colic is a steady or irregular pain in the upper abdomen. It is usually under the right side of the rib cage. It happens when gallstones interfere with the normal flow of bile from the gallbladder. Bile is a liquid that helps to digest fats. Bile is made in the liver and stored in the gallbladder. When you eat a meal, bile passes from the gallbladder through the cystic duct and the common bile duct into the small intestine. There, it mixes with partially digested food. If a gallstone blocks either of these ducts, the normal flow of bile is blocked. The muscle cells in the bile duct contract forcefully to try to move the stone. This causes the pain of biliary colic.  SYMPTOMS   A person with biliary colic usually complains of pain in the upper abdomen. This pain can be:  In the center of the upper abdomen just below the breastbone.  In the upper-right part of the abdomen, near the gallbladder and liver.  Spread back toward the right shoulder blade.  Nausea and vomiting.  The pain usually occurs after eating.  Biliary colic is usually triggered by the digestive system's demand for bile. The demand for bile is high after fatty meals. Symptoms can also occur when a person who has been fasting suddenly eats a very large meal. Most episodes of biliary colic pass after 1 to 5 hours. After the most intense pain passes, your abdomen may continue to ache mildly for about 24 hours. DIAGNOSIS  After you  describe your symptoms, your caregiver will perform a physical exam. He or she will pay attention to the upper right portion of your belly (abdomen). This is the area of your liver and gallbladder. An ultrasound will help your caregiver look for gallstones. Specialized scans of the gallbladder may also be done. Blood tests may be done, especially if you have fever or if your pain persists. PREVENTION  Biliary colic can be prevented by controlling the risk factors for gallstones. Some of these risk factors, such as heredity, increasing age, and pregnancy are a normal part of life. Obesity and a high-fat diet are risk factors you can change through a healthy lifestyle. Women going through menopause who take hormone replacement therapy (estrogen) are also more likely to develop biliary colic. TREATMENT   Pain medication may be prescribed.  You may be encouraged to eat a fat-free diet.  If the first episode of biliary colic is severe, or episodes of colic keep retuning, surgery to remove the gallbladder (cholecystectomy) is usually recommended. This procedure can be done through small incisions using an instrument called a laparoscope. The procedure often requires a brief stay in the hospital.  Some people can leave the hospital the same day. It is the most widely used treatment in people troubled by painful gallstones. It is effective and safe, with no complications in more than 90% of cases.  If surgery cannot be done, medication that dissolves gallstones may be used. This medication is expensive and can take months or years to work. Only small stones will dissolve.  Rarely, medication to dissolve gallstones is combined with a procedure called shock-wave lithotripsy. This procedure uses carefully aimed shock waves to break up gallstones. In many people treated with this procedure, gallstones form again within a few years. PROGNOSIS  If gallstones block your cystic duct or common bile duct, you are at  risk for repeated episodes of biliary colic. There is also a 25% chance that you will develop a gallbladder infection(acute cholecystitis), or some other complication of gallstones within 10 to 20 years. If you have surgery, schedule it at a time that is convenient for you and at a time when you are not sick. HOME CARE INSTRUCTIONS   Drink plenty of clear fluids.  Avoid fatty, greasy or fried foods, or any foods that make your pain worse.  Take medications as directed. SEEK MEDICAL CARE IF:   You develop a fever over 100.5 F (38.1 C).  Your pain gets worse over time.  You develop nausea that prevents you from eating and drinking.  You develop vomiting. SEEK IMMEDIATE MEDICAL CARE IF:   You have continuous or severe belly (abdominal) pain which is not relieved with medications.  You develop nausea and vomiting which is not relieved with medications.  You have symptoms of biliary colic and you suddenly develop a fever and shaking chills. This may signal cholecystitis. Call your caregiver immediately.  You develop a yellow color to your skin or the white part of your eyes (jaundice). Document Released: 03/07/2006 Document Revised: 12/27/2011 Document Reviewed: 05/16/2008 [facility] Patient Information 2015 [place], [place]. This information is not intended to replace advice given to you by your health care provider. Make sure you discuss any questions you have with your health care provider.  You were evaluated in the ED today for your chest discomfort and flank pain. There does not appear to be an emergent cause for your symptoms at this time. It is important to take her medications as directed. He will also need to follow-up with primary care/GI for further evaluation and management of your symptoms. Return to ED for new or worsening symptoms.

## 2015-02-15 NOTE — ED Provider Notes (Signed)
CSN: [id number]     Arrival date & time 02/15/15  0401 History   First MD Initiated Contact with Patient 02/15/15 0503     Chief Complaint  Patient presents with  . Chest Pain  . Flank Pain     (Consider location/radiation/quality/duration/timing/severity/associated sxs/prior Treatment) HPI Debbie Greene is a 29 y.o. female who comes in for evaluation of chest and flank pain. Patient states this is discomfort has been intermittent for the past 3 years. Her most recent exacerbation began 2 weeks ago and has been constant since. She cannot describe her discomfort. She rates her discomfort as a 3/10. Nothing makes it better or worse. No relationship with eating or bowel movements. Denies fevers, abdominal pain, shortness of breath, nausea or vomiting, dizziness, syncope, urinary symptoms, vaginal bleeding or discharge. No recent travels or surgeries, unilateral leg swelling, hemoptysis, exogenous estrogen. No other aggravating or modifying factors.  History reviewed. No pertinent past medical history. Past Surgical History  Procedure Laterality Date  . Therapeutic abortion      Pt states that she has had 2 abortions.    No family history on file. History  Substance Use Topics  . Smoking status: Never Smoker   . Smokeless tobacco: Not on file  . Alcohol Use: Yes     Comment: Occassionally.    OB History    No data available     Review of Systems A 10 point review of systems was completed and was negative except for pertinent positives and negatives as mentioned in the history of present illness     Allergies  Review of patient's allergies indicates no known allergies.  Home Medications   Prior to Admission medications   Medication Sig Start Date End Date Taking? Authorizing Provider  acetaminophen (TYLENOL) 500 MG tablet Take 1,000 mg by mouth every 6 (six) hours as needed for mild pain.   Yes Historical Provider, MD  naproxen sodium (ANAPROX) 220 MG tablet Take 440 mg by  mouth as needed (pain).   Yes Historical Provider, MD  famotidine (PEPCID) 20 MG tablet Take 1 tablet (20 mg total) by mouth 2 (two) times daily. Patient not taking: Reported on 02/15/2015 02/08/14   Raeford Razor, MD  naproxen (NAPROSYN) 500 MG tablet Take 1 tablet (500 mg total) by mouth 2 (two) times daily. 02/15/15   Alma Muegge, PA-C   BP 107/71 mmHg  Pulse 62  Temp(Src) 98.4 F (36.9 C) (Oral)  Resp 19  Ht  (1.702 m)  Wt 150 lb (68.04 kg)  BMI 23.49 kg/m2  SpO2 97%  LMP 01/25/2015 (Approximate) Physical Exam  Constitutional: She is oriented to person, place, and time. She appears well-developed and well-nourished.  HENT:  Head: Normocephalic and atraumatic.  Mouth/Throat: Oropharynx is clear and moist.  Eyes: Conjunctivae are normal. Pupils are equal, round, and reactive to light. Right eye exhibits no discharge. Left eye exhibits no discharge. No scleral icterus.  Neck: Neck supple.  Cardiovascular: Normal rate, regular rhythm and normal heart sounds.   Pulmonary/Chest: Effort normal and breath sounds normal. No respiratory distress. She has no wheezes. She has no rales.  Abdominal: Soft.  Mild left-sided periumbilical tenderness. Abdomen otherwise soft, nondistended without rebound or guarding. Negative Murphy's, McBurney's. No suprapubic tenderness. No CVA tenderness.  Musculoskeletal: She exhibits no tenderness.  Neurological: She is alert and oriented to person, place, and time.  Cranial Nerves II-XII grossly intact  Skin: Skin is warm and dry. No rash noted.  Psychiatric: She has a normal  mood and affect.  Nursing note and vitals reviewed.   ED Course  Procedures (including critical care time) Labs Review Labs Reviewed  CBC - Abnormal; Notable for the following:    Hemoglobin 10.1 (*)    HCT 31.2 (*)    MCH 25.6 (*)    All other components within normal limits  BASIC METABOLIC PANEL  URINALYSIS, ROUTINE W REFLEX MICROSCOPIC  I-STAT TROPOININ, ED  POC  URINE PREG, ED    Imaging Review Dg Chest 2 View  02/15/2015   CLINICAL DATA:  Acute onset of mid chest pain, radiating to the right side of the back. Initial encounter.  EXAM: CHEST  2 VIEW  COMPARISON:  None.  FINDINGS: The lungs are well-aerated and clear. There is no evidence of focal opacification, pleural effusion or pneumothorax.  The heart is normal in size; the mediastinal contour is within normal limits. No acute osseous abnormalities are seen.  IMPRESSION: No acute cardiopulmonary process seen.   Electronically Signed   By: Roanna RaiderJeffery  Chang M.D.   On: 02/15/2015 05:50     EKG Interpretation None      ED ECG REPORT   Date: 02/15/2015  Rate: 78  Rhythm: normal sinus rhythm  QRS Axis: normal  Intervals: normal  ST/T Wave abnormalities: nonspecific T wave changes  Conduction Disutrbances:none  Narrative Interpretation:   Old EKG Reviewed: unchanged  I have personally reviewed the EKG tracing and agree with the computerized printout as noted.  Meds given in ED:  Medications  ketorolac (TORADOL) 30 MG/ML injection 30 mg (30 mg Intramuscular Given 02/15/15 0729)    Discharge Medication List as of 02/15/2015  8:43 AM    START taking these medications   Details  naproxen (NAPROSYN) 500 MG tablet Take 1 tablet (500 mg total) by mouth 2 (two) times daily., Starting 02/15/2015, Until Discontinued, Print       Filed Vitals:   02/15/15 0716 02/15/15 0830 02/15/15 0845 02/15/15 0854  BP: 103/62 100/62 107/71   Pulse: 60 58 62   Temp:    98.4 F (36.9 C)  TempSrc:    Oral  Resp: 20 18 19    Height:      Weight:      SpO2: 97% 97% 97%     MDM  Vitals stable - WNL -afebrile Pt resting comfortably in ED. Chest discomfort in ED Improving PE--Lung exam normal. Cardiac auscultation reveals no murmurs rubs or gallops. Grossly Benign Physical Exam Labwork: Troponin negative. EKG reassuring.  Labs otherwise noncontributory Imaging: CXR shows no acute cardio pulmonary  pathology  DDX: Patient with atypical chest discomfort and right flank discomfort intermittent for the past 3 years with most recent exacerbation constant for the last 2 weeks. Clinical picture and exam today not consistent with ACS/dissection. Heart score 0. No evidence of spontaneous pneumothorax, esophageal rupture or other mediastinitis. PERC negative, doubt PE. No evidence of myocarditis, endocarditis, pericarditis. Doubt kidney stone, pyelo, vascular compromise. Will DC with anti-inflammatories and referral to follow up with GI. I discussed all relevant lab findings and imaging results with pt and they verbalized understanding. Discussed f/u with PCP within 48 hrs and return precautions, pt very amenable to plan.   Final diagnoses:  Atypical chest pain  Right flank pain, chronic       Joycie PeekBenjamin Cypress Hinkson, PA-C 02/15/15 1634  Marisa Severinlga Otter, MD 02/16/15 [phone]

## 2015-12-18 ENCOUNTER — Encounter (HOSPITAL_COMMUNITY): Payer: Self-pay | Admitting: *Deleted

## 2015-12-18 ENCOUNTER — Emergency Department (INDEPENDENT_AMBULATORY_CARE_PROVIDER_SITE_OTHER)
Admission: EM | Admit: 2015-12-18 | Discharge: 2015-12-18 | Disposition: A | Discharge status: Home / Self Care | Payer: Managed Care, Other (non HMO) | Source: Home / Self Care | Attending: Family Medicine | Admitting: Family Medicine

## 2015-12-18 DIAGNOSIS — J069 Acute upper respiratory infection, unspecified: Secondary | ICD-10-CM

## 2015-12-18 MED ORDER — HYDROCOD POLST-CPM POLST ER 10-8 MG/5ML PO SUER: 5.0000 mL | Freq: Two times a day (BID) | ORAL | Status: DC | PRN

## 2015-12-18 MED ORDER — IPRATROPIUM BROMIDE 0.06 % NA SOLN: 2.0000 | Freq: Four times a day (QID) | NASAL | Status: DC

## 2015-12-18 NOTE — Discharge Instructions (Signed)
Drink plenty of fluids as discussed, use medicine as prescribed, and mucinex or delsym for cough. Return or see your doctor if further problems °

## 2015-12-18 NOTE — ED Provider Notes (Signed)
CSN: [id number]     Arrival date & time 12/18/15  1304 History   First MD Initiated Contact with Patient 12/18/15 1333     Chief Complaint  Patient presents with  . URI   (Consider location/radiation/quality/duration/timing/severity/associated sxs/prior Treatment) Patient is a 30 y.o. female presenting with URI. The history is provided by the patient.  URI Presenting symptoms: congestion, cough and rhinorrhea   Presenting symptoms: no fever   Severity:  Moderate Onset quality:  Gradual Duration:  3 days Progression:  Unchanged Chronicity:  New Relieved by:  None tried Worsened by:  Nothing tried Ineffective treatments:  None tried Associated symptoms: no wheezing     History reviewed. No pertinent past medical history. Past Surgical History  Procedure Laterality Date  . Therapeutic abortion      Pt states that she has had 2 abortions.    History reviewed. No pertinent family history. Social History  Substance Use Topics  . Smoking status: Never Smoker   . Smokeless tobacco: None  . Alcohol Use: Yes     Comment: Occassionally.    OB History    No data available     Review of Systems  Constitutional: Negative for fever and chills.  HENT: Positive for congestion, postnasal drip and rhinorrhea.   Respiratory: Positive for cough. Negative for wheezing.   Gastrointestinal: Negative.   Genitourinary: Negative.   All other systems reviewed and are negative.   Allergies  Review of patient's allergies indicates no known allergies.  Home Medications   Prior to Admission medications   Medication Sig Start Date End Date Taking? Authorizing Provider  acetaminophen (TYLENOL) 500 MG tablet Take 1,000 mg by mouth every 6 (six) hours as needed for mild pain.    Historical Provider, MD  chlorpheniramine-HYDROcodone (TUSSIONEX PENNKINETIC ER) 10-8 MG/5ML SUER Take 5 mLs by mouth every 12 (twelve) hours as needed for cough. 12/18/15   Linna Hoff, MD  famotidine (PEPCID) 20 MG  tablet Take 1 tablet (20 mg total) by mouth 2 (two) times daily. Patient not taking: Reported on 02/15/2015 02/08/14   Raeford Razor, MD  ipratropium (ATROVENT) 0.06 % nasal spray Place 2 sprays into both nostrils 4 (four) times daily. 12/18/15   Linna Hoff, MD  naproxen (NAPROSYN) 500 MG tablet Take 1 tablet (500 mg total) by mouth 2 (two) times daily. 02/15/15   Joycie Peek, PA-C  naproxen sodium (ANAPROX) 220 MG tablet Take 440 mg by mouth as needed (pain).    Historical Provider, MD   Meds Ordered and Administered this Visit  Medications - No data to display  BP 113/83 mmHg  Pulse 102  Temp(Src) 100.4 F (38 C) (Oral)  Resp 12  SpO2 100%  LMP 12/12/2015 No data found.   Physical Exam  Constitutional: She is oriented to person, place, and time. She appears well-developed and well-nourished. No distress.  HENT:  Right Ear: External ear normal.  Left Ear: External ear normal.  Mouth/Throat: Oropharynx is clear and moist.  Eyes: Pupils are equal, round, and reactive to light.  Neck: Normal range of motion. Neck supple.  Cardiovascular: Regular rhythm, normal heart sounds and intact distal pulses.   Pulmonary/Chest: Effort normal and breath sounds normal.  Abdominal: Soft. Bowel sounds are normal.  Lymphadenopathy:    She has no cervical adenopathy.  Neurological: She is alert and oriented to person, place, and time.  Skin: Skin is warm and dry.  Nursing note and vitals reviewed.   ED Course  Procedures (including  critical care time)  Labs Review Labs Reviewed - No data to display  Imaging Review No results found.   Visual Acuity Review  Right Eye Distance:   Left Eye Distance:   Bilateral Distance:    Right Eye Near:   Left Eye Near:    Bilateral Near:         MDM   1. URI (upper respiratory infection)        Linna Hoff, MD 12/21/15 1322

## 2015-12-18 NOTE — ED Notes (Signed)
Pt  Reports   Of  Cough   Congestion       Headache  And  Chills      With  Onset  Of  Symptoms        3  Days  Ago

## 2016-07-04 ENCOUNTER — Encounter (HOSPITAL_COMMUNITY): Payer: Self-pay

## 2016-07-04 ENCOUNTER — Emergency Department (HOSPITAL_COMMUNITY)
Admission: EM | Admit: 2016-07-04 | Discharge: 2016-07-04 | Disposition: A | Discharge status: Home / Self Care | Payer: Managed Care, Other (non HMO) | Attending: Emergency Medicine | Admitting: Emergency Medicine

## 2016-07-04 DIAGNOSIS — T192XXA Foreign body in vulva and vagina, initial encounter: Secondary | ICD-10-CM

## 2016-07-04 DIAGNOSIS — Y929 Unspecified place or not applicable: Secondary | ICD-10-CM | POA: Diagnosis not present

## 2016-07-04 DIAGNOSIS — Y999 Unspecified external cause status: Secondary | ICD-10-CM | POA: Diagnosis not present

## 2016-07-04 DIAGNOSIS — Y939 Activity, unspecified: Secondary | ICD-10-CM | POA: Insufficient documentation

## 2016-07-04 DIAGNOSIS — X58XXXA Exposure to other specified factors, initial encounter: Secondary | ICD-10-CM | POA: Diagnosis not present

## 2016-07-04 DIAGNOSIS — R102 Pelvic and perineal pain: Secondary | ICD-10-CM | POA: Diagnosis present

## 2016-07-04 NOTE — ED Triage Notes (Signed)
Per Pt, Pt is coming from home with complaints of a tear in her perineal area that has a hair wrapped around the site causing pain. Pt noted the change early this morning.

## 2016-07-04 NOTE — ED Provider Notes (Signed)
[facility]-EMERGENCY DEPT Provider Note   CSN: [id number] Arrival date & time: 07/04/16  1048     History   Chief Complaint Chief Complaint  Patient presents with  . Vaginal Injury    HPI Debbie Greene is a 30 y.o. female.  Patient states she's had a "skin tag" to her labia for several years after a trauma remotely. She was told to get this removed in the past but never did. It causes her pain intermittently. It has been hurting since yesterday. She has noticed a hair was wrapped around the skin tag itself causing her pain. She attempted to cut her off without success. No vaginal bleeding or discharge. No vaginal pain. No abdominal pain, nausea vomiting. No fever. No urinary symptoms.   The history is provided by the patient.  Vaginal Injury  Pertinent negatives include no chest pain, no abdominal pain, no headaches and no shortness of breath.    History reviewed. No pertinent past medical history.  There are no active problems to display for this patient.   Past Surgical History:  Procedure Laterality Date  . THERAPEUTIC ABORTION     Pt states that she has had 2 abortions.     OB History    No data available       Home Medications    Prior to Admission medications   Medication Sig Start Date End Date Taking? Authorizing Provider  acetaminophen (TYLENOL) 500 MG tablet Take 1,000 mg by mouth every 6 (six) hours as needed for mild pain.    Historical Provider, MD  chlorpheniramine-HYDROcodone (TUSSIONEX PENNKINETIC ER) 10-8 MG/5ML SUER Take 5 mLs by mouth every 12 (twelve) hours as needed for cough. 12/18/15   Linna Hoff, MD  famotidine (PEPCID) 20 MG tablet Take 1 tablet (20 mg total) by mouth 2 (two) times daily. Patient not taking: Reported on 02/15/2015 02/08/14   Raeford Razor, MD  ipratropium (ATROVENT) 0.06 % nasal spray Place 2 sprays into both nostrils 4 (four) times daily. 12/18/15   Linna Hoff, MD  naproxen (NAPROSYN) 500 MG tablet Take 1 tablet (500 mg total)  by mouth 2 (two) times daily. 02/15/15   Joycie Peek, PA-C  naproxen sodium (ANAPROX) 220 MG tablet Take 440 mg by mouth as needed (pain).    Historical Provider, MD    Family History No family history on file.  Social History Social History  Substance Use Topics  . Smoking status: Never Smoker  . Smokeless tobacco: Never Used  . Alcohol use Yes     Comment: Occassionally.      Allergies   Review of patient's allergies indicates no known allergies.   Review of Systems Review of Systems  Constitutional: Negative for activity change, appetite change and fever.  HENT: Negative for congestion and rhinorrhea.   Respiratory: Negative for cough, chest tightness, shortness of breath and wheezing.   Cardiovascular: Negative for chest pain.  Gastrointestinal: Negative for abdominal pain, nausea and vomiting.  Genitourinary: Negative for flank pain and pelvic pain.  Musculoskeletal: Negative for arthralgias and myalgias.  Skin: Positive for wound.  Neurological: Negative for dizziness, weakness, light-headedness and headaches.  A complete 10 system review of systems was obtained and all systems are negative except as noted in the HPI and PMH.     Physical Exam Updated Vital Signs BP 109/69   Pulse 83   Temp 98.6 F (37 C) (Oral)   Resp 18   Ht 5\' 7"  (1.702 m)   Wt 152 lb (68.9  kg)   SpO2 100%   BMI 23.81 kg/m   Physical Exam  Constitutional: She is oriented to person, place, and time. She appears well-developed and well-nourished. No distress.  HENT:  Head: Normocephalic and atraumatic.  Mouth/Throat: Oropharynx is clear and moist. No oropharyngeal exudate.  Eyes: Conjunctivae and EOM are normal. Pupils are equal, round, and reactive to light.  Neck: Normal range of motion. Neck supple.  No meningismus.  Cardiovascular: Normal rate, regular rhythm, normal heart sounds and intact distal pulses.   No murmur heard. Pulmonary/Chest: Effort normal and breath sounds  normal. No respiratory distress.  Abdominal: Soft. There is no tenderness. There is no rebound and no guarding.  Genitourinary:  Genitourinary Comments: Chaperone present for GU exam. Normal external genitalia other than a large pedunculated skin tag on the right labia. There is a hair wrapped tightly around the skin tag No vaginal bleeding or obvious trauma.  Musculoskeletal: Normal range of motion. She exhibits no edema or tenderness.  Neurological: She is alert and oriented to person, place, and time. No cranial nerve deficit. She exhibits normal muscle tone. Coordination normal.   5/5 strength throughout. CN 2-12 intact.Equal grip strength.   Skin: Skin is warm.  Psychiatric: She has a normal mood and affect. Her behavior is normal.  Nursing note and vitals reviewed.    ED Treatments / Results  Labs (all labs ordered are listed, but only abnormal results are displayed) Labs Reviewed - No data to display  EKG  EKG Interpretation None       Radiology No results found.  Procedures .Foreign Body Removal Date/Time: 07/04/2016 6:27 PM Performed by: Glynn OctaveANCOUR, Sayeed Weatherall Authorized by: Glynn OctaveANCOUR, Jordynn Marcella  Consent: Verbal consent obtained. Risks and benefits: risks, benefits and alternatives were discussed Consent given by: patient Patient understanding: patient states understanding of the procedure being performed Patient identity confirmed: verbally with patient Time out: Immediately prior to procedure a "time out" was called to verify the correct patient, procedure, equipment, support staff and site/side marked as required. Body area: vagina  Sedation: Patient sedated: no Patient restrained: no Patient cooperative: yes Localization method: visualized Removal mechanism: digital extraction Complexity: simple 1 objects recovered. Objects recovered: hair Post-procedure assessment: foreign body removed Patient tolerance: Patient tolerated the procedure well with no immediate  complications Comments: Darene Lamerair used to dissolve hair tourniquet   (including critical care time)  Medications Ordered in ED Medications - No data to display   Initial Impression / Assessment and Plan / ED Course  I have reviewed the triage vital signs and the nursing notes.  Pertinent labs & imaging results that were available during my care of the patient were reviewed by me and considered in my medical decision making (see chart for details).  Clinical Course   Patient with essentially hair tourniquet of vaginal skin tag. she is in no distress. Attempt to use Darene LamerNair for her removal.  Darene Lamerair was successful for foreign body removal. Patient encouraged to follow up with gynecology for likely removal of the skin tag. Return precautions discussed. Her tetanus is up-to-date.    Final Clinical Impressions(s) / ED Diagnoses   Final diagnoses:  Foreign body in vagina, initial encounter    New Prescriptions New Prescriptions   No medications on file     Glynn OctaveStephen Madge Therrien, MD 07/04/16 2341

## 2016-07-04 NOTE — Discharge Instructions (Signed)
Follow up with the gynecologist Return to the ED if you develop new or worsening symptoms.

## 2016-10-01 ENCOUNTER — Encounter: Payer: Self-pay | Admitting: Certified Nurse Midwife

## 2016-10-01 ENCOUNTER — Ambulatory Visit (INDEPENDENT_AMBULATORY_CARE_PROVIDER_SITE_OTHER): Payer: Managed Care, Other (non HMO) | Admitting: Certified Nurse Midwife

## 2016-10-01 VITALS — BP 112/73 | HR 80 | Ht 68.0 in | Wt 157.2 lb

## 2016-10-01 DIAGNOSIS — Z3202 Encounter for pregnancy test, result negative: Secondary | ICD-10-CM

## 2016-10-01 DIAGNOSIS — Z0000 Encounter for general adult medical examination without abnormal findings: Secondary | ICD-10-CM | POA: Diagnosis not present

## 2016-10-01 DIAGNOSIS — Z01419 Encounter for gynecological examination (general) (routine) without abnormal findings: Secondary | ICD-10-CM | POA: Diagnosis not present

## 2016-10-01 DIAGNOSIS — N96 Recurrent pregnancy loss: Secondary | ICD-10-CM | POA: Insufficient documentation

## 2016-10-01 DIAGNOSIS — Z113 Encounter for screening for infections with a predominantly sexual mode of transmission: Secondary | ICD-10-CM

## 2016-10-01 DIAGNOSIS — Z3009 Encounter for other general counseling and advice on contraception: Secondary | ICD-10-CM

## 2016-10-01 DIAGNOSIS — Z7251 High risk heterosexual behavior: Secondary | ICD-10-CM | POA: Insufficient documentation

## 2016-10-01 HISTORY — DX: High risk heterosexual behavior: Z72.51

## 2016-10-01 LAB — POCT URINE PREGNANCY: Preg Test, Ur: NEGATIVE

## 2016-10-01 NOTE — Progress Notes (Signed)
Subjective:        Debbie Greene is a 30 y.o. female here for a routine exam.  Current complaints: none, desires birth control.  [place].  Desires skin, labial removal from past trauma that never healed correctly.  Has had multiple abortions, does not desire pregnancy, last unprotected sexual intercourse 2-3 days ago.  Negative UPT today in office.       Personal health questionnaire:  Is patient Ashkenazi Jewish, have a family history of breast and/or ovarian cancer: yes: MGM Is there a family history of uterine cancer diagnosed at age < 9250, gastrointestinal cancer, urinary tract cancer, family member who is a Personnel officerLynch syndrome-associated carrier: no Is the patient overweight and hypertensive, family history of diabetes, personal history of gestational diabetes, preeclampsia or PCOS: no Is patient over 7955, have PCOS,  family history of premature CHD under age 28, diabetes, smoke, have hypertension or peripheral artery disease:  no At any time, has a partner hit, kicked or otherwise hurt or frightened you?: no Over the past 2 weeks, have you felt down, depressed or hopeless?: no Over the past 2 weeks, have you felt little interest or pleasure in doing things?:no   Gynecologic History Patient's last menstrual period was 08/27/2016 (approximate). Contraception: none Last Pap: unknown. Results were: normal according to the patient Last mammogram: n/a.   Obstetric History OB History  Gravida Para Term Preterm AB Living  4 1 1   3 1   SAB TAB Ectopic Multiple Live Births    3     1    # Outcome Date GA Lbr Len/2nd Weight Sex Delivery Anes PTL Lv  4 Term 06/04/08 [id number]  7 lb 2 oz (3.232 kg) M Vag-Spont EPI  LIV  3 TAB           2 TAB           1 TAB               History reviewed. No pertinent past medical history.  Past Surgical History:  Procedure Laterality Date  . THERAPEUTIC ABORTION     Pt states that she has had 2 abortions.      Current Outpatient  Prescriptions:  .  acetaminophen (TYLENOL) 500 MG tablet, Take 1,000 mg by mouth every 6 (six) hours as needed for mild pain., Disp: , Rfl:  No Known Allergies  Social History  Substance Use Topics  . Smoking status: Never Smoker  . Smokeless tobacco: Never Used  . Alcohol use Yes     Comment: Occassionally.     Family History  Problem Relation Age of Onset  . Cancer Maternal Grandmother     Breast      Review of Systems  Constitutional: negative for fatigue and weight loss Respiratory: negative for cough and wheezing Cardiovascular: negative for chest pain, fatigue and palpitations Gastrointestinal: negative for abdominal pain and change in bowel habits Musculoskeletal:negative for myalgias Neurological: negative for gait problems and tremors Behavioral/Psych: negative for abusive relationship, depression Endocrine: negative for temperature intolerance    Genitourinary:negative for abnormal menstrual periods, genital lesions, hot flashes, sexual problems and vaginal discharge Integument/breast: negative for breast lump, breast tenderness, nipple discharge and skin lesion(s)    Objective:       BP 112/73   Pulse 80   Ht 5\' 8"  (1.727 m)   Wt 157 lb 3.2 oz (71.3 kg)   LMP 08/27/2016 (Approximate) Comment: Patient has irregular cycles- still monthly  BMI 23.90  kg/m  General:   alert  Skin:   no rash or abnormalities  Lungs:   clear to auscultation bilaterally  Heart:   regular rate and rhythm, S1, S2 normal, no murmur, click, rub or gallop  Breasts:   normal without suspicious masses, skin or nipple changes or axillary nodes  Abdomen:  normal findings: no organomegaly, soft, non-tender and no hernia  Pelvis:  External genitalia: normal general appearance Urinary system: urethral meatus normal and bladder without fullness, nontender Vaginal: normal without tenderness, induration or masses Cervix: normal appearance Adnexa: normal bimanual exam Uterus: anteverted and  non-tender, normal size   Lab Review Urine pregnancy test Labs reviewed yes Radiologic studies reviewed no  50% of 30 min visit spent on counseling and coordination of care.    Assessment:    Healthy female exam.    Frequent abortions  High risk sexual behavior  Contraception counseling  STD screening exam   Plan:    Education reviewed: calcium supplements, depression evaluation, low fat, low cholesterol diet, safe sex/STD prevention, self breast exams, skin cancer screening and weight bearing exercise. Contraception: Kyleena planned with next period. Follow up in: 1 week.   No orders of the defined types were placed in this encounter.  Orders Placed This Encounter  Procedures  . RPR  . Hepatitis C antibody  . Hepatitis B surface antigen  . HIV antibody  . NuSwab Vaginitis Plus (VG+)  . POCT urine pregnancy   Need to obtain previous records

## 2016-10-02 LAB — RPR: RPR Ser Ql: NONREACTIVE

## 2016-10-02 LAB — HEPATITIS B SURFACE ANTIGEN: Hepatitis B Surface Ag: NEGATIVE

## 2016-10-02 LAB — HIV ANTIBODY (ROUTINE TESTING W REFLEX): HIV Screen 4th Generation wRfx: NONREACTIVE

## 2016-10-02 LAB — HEPATITIS C ANTIBODY: Hep C Virus Ab: <0.1 s/co ratio (ref 0.0–0.9)

## 2016-10-04 ENCOUNTER — Ambulatory Visit: Payer: Managed Care, Other (non HMO) | Admitting: Obstetrics & Gynecology

## 2016-10-05 LAB — CYTOLOGY - PAP: Diagnosis: NEGATIVE

## 2016-10-07 LAB — NUSWAB VAGINITIS PLUS (VG+)
Atopobium vaginae: HIGH Score — AB
BVAB 2: HIGH Score — AB
Candida albicans, NAA: NEGATIVE
Candida glabrata, NAA: NEGATIVE
Chlamydia trachomatis, NAA: NEGATIVE
MEGASPHAERA 1: HIGH {score} — AB
NEISSERIA GONORRHOEAE, NAA: NEGATIVE
Trich vag by NAA: NEGATIVE

## 2016-10-08 ENCOUNTER — Other Ambulatory Visit: Payer: Self-pay | Admitting: Certified Nurse Midwife

## 2016-10-08 DIAGNOSIS — B9689 Other specified bacterial agents as the cause of diseases classified elsewhere: Secondary | ICD-10-CM

## 2016-10-08 DIAGNOSIS — N760 Acute vaginitis: Principal | ICD-10-CM

## 2016-10-08 MED ORDER — METRONIDAZOLE 500 MG PO TABS: 500.0000 mg | ORAL_TABLET | Freq: Two times a day (BID) | ORAL | 0 refills | Status: DC

## 2016-10-12 ENCOUNTER — Telehealth: Payer: Self-pay | Admitting: *Deleted

## 2016-10-12 DIAGNOSIS — N760 Acute vaginitis: Principal | ICD-10-CM

## 2016-10-12 DIAGNOSIS — B9689 Other specified bacterial agents as the cause of diseases classified elsewhere: Secondary | ICD-10-CM

## 2016-10-12 MED ORDER — METRONIDAZOLE 0.75 % VA GEL: 1.0000 | Freq: Every day | VAGINAL | 0 refills | Status: AC

## 2016-10-12 NOTE — Telephone Encounter (Signed)
Notified patient of BV.  Patient requests the vaginal gel versus pills.  Rx routed to pharmacy per protocol.  Instructed patient to avoid alcohol while taking this prescription.

## 2016-10-29 ENCOUNTER — Ambulatory Visit: Payer: Managed Care, Other (non HMO) | Admitting: Certified Nurse Midwife

## 2016-11-05 ENCOUNTER — Ambulatory Visit: Payer: Managed Care, Other (non HMO) | Admitting: Certified Nurse Midwife

## 2016-11-29 ENCOUNTER — Ambulatory Visit: Payer: Managed Care, Other (non HMO) | Admitting: Obstetrics and Gynecology

## 2016-11-29 ENCOUNTER — Ambulatory Visit: Payer: Managed Care, Other (non HMO) | Admitting: Certified Nurse Midwife

## 2017-01-04 ENCOUNTER — Inpatient Hospital Stay (HOSPITAL_COMMUNITY)
Admission: AD | Admit: 2017-01-04 | Discharge: 2017-01-04 | Disposition: A | Discharge status: Home / Self Care | Payer: Managed Care, Other (non HMO) | Source: Ambulatory Visit | Attending: Family Medicine | Admitting: Family Medicine

## 2017-01-04 ENCOUNTER — Inpatient Hospital Stay (HOSPITAL_COMMUNITY): Payer: Managed Care, Other (non HMO)

## 2017-01-04 ENCOUNTER — Telehealth: Payer: Self-pay | Admitting: Advanced Practice Midwife

## 2017-01-04 ENCOUNTER — Encounter (HOSPITAL_COMMUNITY): Payer: Self-pay | Admitting: *Deleted

## 2017-01-04 DIAGNOSIS — O209 Hemorrhage in early pregnancy, unspecified: Secondary | ICD-10-CM | POA: Insufficient documentation

## 2017-01-04 DIAGNOSIS — A6009 Herpesviral infection of other urogenital tract: Secondary | ICD-10-CM

## 2017-01-04 DIAGNOSIS — O26891 Other specified pregnancy related conditions, first trimester: Secondary | ICD-10-CM | POA: Diagnosis not present

## 2017-01-04 DIAGNOSIS — R109 Unspecified abdominal pain: Secondary | ICD-10-CM | POA: Insufficient documentation

## 2017-01-04 DIAGNOSIS — O98311 Other infections with a predominantly sexual mode of transmission complicating pregnancy, first trimester: Secondary | ICD-10-CM

## 2017-01-04 DIAGNOSIS — Z3A01 Less than 8 weeks gestation of pregnancy: Secondary | ICD-10-CM | POA: Diagnosis not present

## 2017-01-04 DIAGNOSIS — N939 Abnormal uterine and vaginal bleeding, unspecified: Secondary | ICD-10-CM | POA: Diagnosis present

## 2017-01-04 DIAGNOSIS — O3680X0 Pregnancy with inconclusive fetal viability, not applicable or unspecified: Secondary | ICD-10-CM

## 2017-01-04 LAB — URINALYSIS, ROUTINE W REFLEX MICROSCOPIC
BILIRUBIN URINE: NEGATIVE
Bacteria, UA: NONE SEEN
Glucose, UA: 50 mg/dL — AB
KETONES UR: NEGATIVE mg/dL
LEUKOCYTES UA: NEGATIVE
Nitrite: NEGATIVE
PH: 5 (ref 5.0–8.0)
Protein, ur: NEGATIVE mg/dL
SPECIFIC GRAVITY, URINE: 1.027 (ref 1.005–1.030)

## 2017-01-04 LAB — WET PREP, GENITAL
Sperm: NONE SEEN
Trich, Wet Prep: NONE SEEN
Yeast Wet Prep HPF POC: NONE SEEN

## 2017-01-04 LAB — CBC
HCT: 28 % — ABNORMAL LOW (ref 36.0–46.0)
Hemoglobin: 9.1 g/dL — ABNORMAL LOW (ref 12.0–15.0)
MCH: 24.3 pg — ABNORMAL LOW (ref 26.0–34.0)
MCHC: 32.5 g/dL (ref 30.0–36.0)
MCV: 74.7 fL — AB (ref 78.0–100.0)
Platelets: 273 10*3/uL (ref 150–400)
RBC: 3.75 MIL/uL — ABNORMAL LOW (ref 3.87–5.11)
RDW: 16.9 % — ABNORMAL HIGH (ref 11.5–15.5)
WBC: 6.5 10*3/uL (ref 4.0–10.5)

## 2017-01-04 LAB — POCT PREGNANCY, URINE: PREG TEST UR: POSITIVE — AB

## 2017-01-04 LAB — HCG, QUANTITATIVE, PREGNANCY: HCG, BETA CHAIN, QUANT, S: 2141 m[IU]/mL — AB (ref <5)

## 2017-01-04 MED ORDER — VALACYCLOVIR HCL 1 G PO TABS: 1000.0000 mg | ORAL_TABLET | Freq: Every day | ORAL | 10 refills | Status: DC

## 2017-01-04 NOTE — MAU Note (Signed)
MAU provider called pt. Pt will come back to MAU to be seen

## 2017-01-04 NOTE — MAU Note (Signed)
Pt back in lobby. CNM and lab notified.

## 2017-01-04 NOTE — Telephone Encounter (Signed)
Pt not in lobby when called.  Called pt to notify her of positive pregnancy test and recommendation to evaluate for possible causes for bleeding including ectopic pregnancy.  Pt had left MAU because of long wait but is returned in a few minutes for further evaluation.

## 2017-01-04 NOTE — MAU Note (Signed)
Not in lobby x1  

## 2017-01-04 NOTE — MAU Provider Note (Signed)
Chief Complaint: Abdominal Pain and Metrorrhagia   First Provider Initiated Contact with Patient 01/04/17 2154      SUBJECTIVE HPI: Debbie Greene is a 31 y.o. G5P1031 at [redacted]w[redacted]d by LMP who presents to maternity admissions reporting vaginal bleeding intermittently x 1 week, lighter than menses and intermittent abdominal cramping x 1 week.  There are no associated symptoms. She has not tried any treatments.  She also reports she was diagnosed with genital herpes a few months ago and prescribed daily suppression therapy but is not sure this is safe in pregnancy. She denies current symptoms of outbreak. She denies vaginal itching/burning, urinary symptoms, h/a, dizziness, n/v, or fever/chills.     HPI  History reviewed. No pertinent past medical history. Past Surgical History:  Procedure Laterality Date  . THERAPEUTIC ABORTION     Pt states that she has had 2 abortions.    Social History   Social History  . Marital status: Single    Spouse name: N/A  . Number of children: N/A  . Years of education: N/A   Occupational History  . Not on file.   Social History Main Topics  . Smoking status: Never Smoker  . Smokeless tobacco: Never Used  . Alcohol use Yes     Comment: Occassionally.   . Drug use: No  . Sexual activity: Yes    Partners: Male    Birth control/ protection: None   Other Topics Concern  . Not on file   Social History Narrative  . No narrative on file   No current facility-administered medications on file prior to encounter.    Current Outpatient Prescriptions on File Prior to Encounter  Medication Sig Dispense Refill  . metroNIDAZOLE (FLAGYL) 500 MG tablet Take 1 tablet (500 mg total) by mouth 2 (two) times daily. (Patient not taking: Reported on 01/04/2017) 14 tablet 0   No Known Allergies  ROS:  Review of Systems  Constitutional: Negative for chills, fatigue and fever.  Respiratory: Negative for shortness of breath.   Cardiovascular: Negative for chest  pain.  Gastrointestinal: Positive for abdominal pain.  Genitourinary: Positive for pelvic pain and vaginal bleeding. Negative for difficulty urinating, dysuria, flank pain, vaginal discharge and vaginal pain.  Neurological: Negative for dizziness and headaches.  Psychiatric/Behavioral: Negative.      I have reviewed patient's Past Medical Hx, Surgical Hx, Family Hx, Social Hx, medications and allergies.   Physical Exam  Patient Vitals for the past 24 hrs:  BP Temp Temp src Pulse Resp Height Weight  01/04/17 2236 107/69 - - 71 16 - -  01/04/17 2044 119/77 - - 91 18 - -  01/04/17 1645 108/73 98.8 F (37.1 C) Oral 87 18 5\' 8"  (1.727 m) 158 lb (71.7 kg)   Constitutional: Well-developed, well-nourished female in no acute distress.  Cardiovascular: normal rate Respiratory: normal effort GI: Abd soft, non-tender. Pos BS x 4 MS: Extremities nontender, no edema, normal ROM Neurologic: Alert and oriented x 4.  GU: Neg CVAT.  PELVIC EXAM: Cervix pink, visually closed, without lesion,small amount dark red bleeding, vaginal walls and external genitalia normal Bimanual exam: Cervix 0/long/high, firm, anterior, neg CMT, uterus nontender, nonenlarged, adnexa without tenderness, enlargement, or mass   LAB RESULTS Results for orders placed or performed during the hospital encounter of 01/04/17 (from the past 24 hour(s))  Urinalysis, Routine w reflex microscopic     Status: Abnormal   Collection Time: 01/04/17  4:50 PM  Result Value Ref Range   Color, Urine YELLOW YELLOW  APPearance CLEAR CLEAR   Specific Gravity, Urine 1.027 1.005 - 1.030   pH 5.0 5.0 - 8.0   Glucose, UA 50 (A) NEGATIVE mg/dL   Hgb urine dipstick SMALL (A) NEGATIVE   Bilirubin Urine NEGATIVE NEGATIVE   Ketones, ur NEGATIVE NEGATIVE mg/dL   Protein, ur NEGATIVE NEGATIVE mg/dL   Nitrite NEGATIVE NEGATIVE   Leukocytes, UA NEGATIVE NEGATIVE   RBC / HPF 0-5 0 - 5 RBC/hpf   WBC, UA 0-5 0 - 5 WBC/hpf   Bacteria, UA NONE SEEN  NONE SEEN   Squamous Epithelial / LPF 0-5 (A) NONE SEEN   Mucous PRESENT   Pregnancy, urine POC     Status: Abnormal   Collection Time: 01/04/17  5:27 PM  Result Value Ref Range   Preg Test, Ur POSITIVE (A) NEGATIVE  CBC     Status: Abnormal   Collection Time: 01/04/17  5:35 PM  Result Value Ref Range   WBC 6.5 4.0 - 10.5 K/uL   RBC 3.75 (L) 3.87 - 5.11 MIL/uL   Hemoglobin 9.1 (L) 12.0 - 15.0 g/dL   HCT 62.9 (L) 52.8 - 41.3 %   MCV 74.7 (L) 78.0 - 100.0 fL   MCH 24.3 (L) 26.0 - 34.0 pg   MCHC 32.5 30.0 - 36.0 g/dL   RDW 24.4 (H) 01.0 - 27.2 %   Platelets 273 150 - 400 K/uL  hCG, quantitative, pregnancy     Status: Abnormal   Collection Time: 01/04/17  8:26 PM  Result Value Ref Range   hCG, Beta Chain, Quant, S 2,141 (H) <5 mIU/mL  ABO/Rh     Status: None (Preliminary result)   Collection Time: 01/04/17  8:26 PM  Result Value Ref Range   ABO/RH(D) O NEG     --/--/O NEG (03/20 2026)  IMAGING US Ob Comp Less 14 Wks  Result Date: 01/04/2017 CLINICAL DATA:  Acute onset of vaginal bleeding and lower abdominal pain. Initial encounter. EXAM: OBSTETRIC <14 WK [place] AND TRANSVAGINAL OB US TECHNIQUE: Both transabdominal and transvaginal ultrasound examinations were performed for complete evaluation of the gestation as well as the maternal uterus, adnexal regions, and pelvic cul-de-sac. Transvaginal technique was performed to assess early pregnancy. COMPARISON:  CT of the abdomen and pelvis performed 04/23/2013 FINDINGS: Intrauterine gestational sac: None seen. Yolk sac:  N/A Embryo:  N/A Subchorionic hemorrhage:  None visualized. Maternal uterus/adnexae: The uterus is grossly unremarkable in appearance, aside from a small amount of complex fluid within the endometrial canal. The ovaries are normal in appearance. The right ovary measures 4.2 x 2.0 x 2.1 cm, while the left ovary measures 3.6 x 2.2 x 2.2 cm. No suspicious adnexal masses are seen; there is no evidence for ovarian torsion. Trace free  fluid is seen within the pelvic cul-de-sac. IMPRESSION: 1. No intrauterine gestational sac seen. No evidence for ectopic pregnancy at this time. Would correlate with the patient's quantitative beta hCG level. If it continues to trend upward, follow-up pelvic ultrasound could be performed in 2 weeks. 2. Small amount of complex fluid within the endometrial canal may correspond to the patient's acute vaginal bleeding. Electronically Signed   By: Roanna Raider M.D.   On: 01/04/2017 21:18   US Ob Transvaginal  Result Date: 01/04/2017 CLINICAL DATA:  Acute onset of vaginal bleeding and lower abdominal pain. Initial encounter. EXAM: OBSTETRIC <14 WK [place] AND TRANSVAGINAL OB US TECHNIQUE: Both transabdominal and transvaginal ultrasound examinations were performed for complete evaluation of the gestation as well as the maternal  uterus, adnexal regions, and pelvic cul-de-sac. Transvaginal technique was performed to assess early pregnancy. COMPARISON:  CT of the abdomen and pelvis performed 04/23/2013 FINDINGS: Intrauterine gestational sac: None seen. Yolk sac:  N/A Embryo:  N/A Subchorionic hemorrhage:  None visualized. Maternal uterus/adnexae: The uterus is grossly unremarkable in appearance, aside from a small amount of complex fluid within the endometrial canal. The ovaries are normal in appearance. The right ovary measures 4.2 x 2.0 x 2.1 cm, while the left ovary measures 3.6 x 2.2 x 2.2 cm. No suspicious adnexal masses are seen; there is no evidence for ovarian torsion. Trace free fluid is seen within the pelvic cul-de-sac. IMPRESSION: 1. No intrauterine gestational sac seen. No evidence for ectopic pregnancy at this time. Would correlate with the patient's quantitative beta hCG level. If it continues to trend upward, follow-up pelvic ultrasound could be performed in 2 weeks. 2. Small amount of complex fluid within the endometrial canal may correspond to the patient's acute vaginal bleeding. Electronically Signed    By: Roanna RaiderJeffery  Chang M.D.   On: 01/04/2017 21:18    MAU Management/MDM: Ordered labs and reviewed results.  Findings today could represent a normal early pregnancy, spontaneous abortion or ectopic pregnancy which can be life-threatening.  Ectopic precautions were given to the patient with plan to return in 48 hours for repeat quant hcg to evaluate pregnancy development.  Blood type O neg but with very early pregnancy, risks of seroconversion insignificant.  Also bleeding x 1 week prior to evaluation in MAU. Ectopic precautions reviewed.  [facility] Pt stable at time of discharge.  ASSESSMENT 1. Pregnancy of unknown anatomic location   2. Vaginal bleeding in pregnancy, first trimester   3. Genital herpes affecting pregnancy in first trimester     PLAN Discharge home Allergies as of 01/04/2017   No Known Allergies     Medication List    STOP taking these medications   metroNIDAZOLE 500 MG tablet Commonly known as:  FLAGYL     TAKE these medications   valACYclovir 1000 MG tablet Commonly known as:  VALTREX Take 1 tablet (1,000 mg total) by mouth daily.      Follow-up Information    [facility]   Specialty:  Obstetrics and Gynecology Why:  On Friday at 8 am for repeat labs. REturn to MAU sooner as needed for emergencies. Contact information: [address] [place] [id number] [phone]          Sharen CounterLisa Leftwich-Kirby Certified Nurse-Midwife 01/04/2017  10:45 PM

## 2017-01-04 NOTE — MAU Note (Signed)
Pt C/O lower abd pain for the last week, periods are irregular.  Period started March 1st, bled 4-5 days, stopped, then started bleeding 12 days ago, still bleeding.

## 2017-01-05 LAB — GC/CHLAMYDIA PROBE AMP (~~LOC~~) NOT AT ARMC
CHLAMYDIA, DNA PROBE: NEGATIVE
NEISSERIA GONORRHEA: NEGATIVE

## 2017-01-05 LAB — HIV ANTIBODY (ROUTINE TESTING W REFLEX): HIV SCREEN 4TH GENERATION: NONREACTIVE

## 2017-01-05 LAB — ABO/RH: ABO/RH(D): O NEG

## 2017-01-07 ENCOUNTER — Other Ambulatory Visit: Payer: Self-pay | Admitting: Obstetrics & Gynecology

## 2017-01-07 ENCOUNTER — Ambulatory Visit: Payer: Managed Care, Other (non HMO)

## 2017-01-07 DIAGNOSIS — O3680X0 Pregnancy with inconclusive fetal viability, not applicable or unspecified: Secondary | ICD-10-CM

## 2017-01-07 LAB — HCG, QUANTITATIVE, PREGNANCY: HCG, BETA CHAIN, QUANT, S: 1342 m[IU]/mL — AB (ref <5)

## 2017-01-07 MED ORDER — MISOPROSTOL 200 MCG PO TABS: 800.0000 ug | ORAL_TABLET | Freq: Every day | ORAL | 0 refills | Status: DC

## 2017-01-07 MED ORDER — MISOPROSTOL 200 MCG PO TABS: 200.0000 ug | ORAL_TABLET | Freq: Four times a day (QID) | ORAL | 0 refills | Status: DC

## 2017-01-07 NOTE — Progress Notes (Addendum)
Patient presented to the office today for stat bhcg. Patient reports some mild pain and bleeding at this time. She has been instructed to wait in our office  for her results. Discuss results with provider Dr.Harraway-Smith who suggest patient is having a miscarriage at this time.  Patient can be offered cyctoc to help speed up the process and to help with her pain if she prefers. Cyctotec has been called in to her pharmacy. She has been instructed on how to take this medications.. I advised patient to take tyenlol for pain and to monitor her bleeding. If she should start soaking up 1-2 pads every hour she should report to the MAU to be seen.  Patient has been scheduled for a follow up beta in one week schedule for April 2,2018 since the office will be closed on next Friday 01/14/2017 at 8:00 am. Patient voice understanding at this time.

## 2017-01-07 NOTE — Addendum Note (Signed)
Addended by: Cheree DittoGRAHAM, DEMETRICE A on: 01/07/2017 12:01 PM   Modules accepted: Orders

## 2017-01-10 ENCOUNTER — Telehealth: Payer: Self-pay | Admitting: *Deleted

## 2017-01-10 NOTE — Telephone Encounter (Signed)
Per discussion with Demetrice Cheree DittoGraham, CMA - Dr. Erin FullingHarraway-Smith called pharmacy and clarified order to be per vagina on 01/07/17.

## 2017-01-10 NOTE — Telephone Encounter (Signed)
Received a voice message from 01/07/17 to clarify if prescription they received for cytotec 200mcg was for by mouth or per vagina.

## 2017-01-17 ENCOUNTER — Other Ambulatory Visit: Payer: Managed Care, Other (non HMO)

## 2017-01-17 DIAGNOSIS — O3680X0 Pregnancy with inconclusive fetal viability, not applicable or unspecified: Secondary | ICD-10-CM

## 2017-01-18 LAB — BETA HCG QUANT (REF LAB): HCG QUANT: 146 m[IU]/mL

## 2017-01-24 ENCOUNTER — Telehealth: Payer: Self-pay | Admitting: General Practice

## 2017-01-24 NOTE — Telephone Encounter (Signed)
Patient called and left message requesting paperwork be filled out for her job. Called patient and discussed that she will need to come by our office and sign a ROI and drop off the forms for [place] to complete. Patient verbalized understanding & had no questions

## 2017-09-15 IMAGING — US US OB TRANSVAGINAL
1 series · 15 of 28 positions shown · non-contrast
Comparison: CT of the abdomen and pelvis performed 04/23/2013

CLINICAL DATA: Acute onset of vaginal bleeding and lower abdominal
pain. Initial encounter.

EXAM:
OBSTETRIC <14 WK US AND TRANSVAGINAL OB US
TECHNIQUE: Both transabdominal and transvaginal ultrasound examinations were
performed for complete evaluation of the gestation as well as the
maternal uterus, adnexal regions, and pelvic cul-de-sac.
Transvaginal technique was performed to assess early pregnancy.

[Series 1: us ob transvaginal · 15 of 52 slices shown]
[im 1/52]
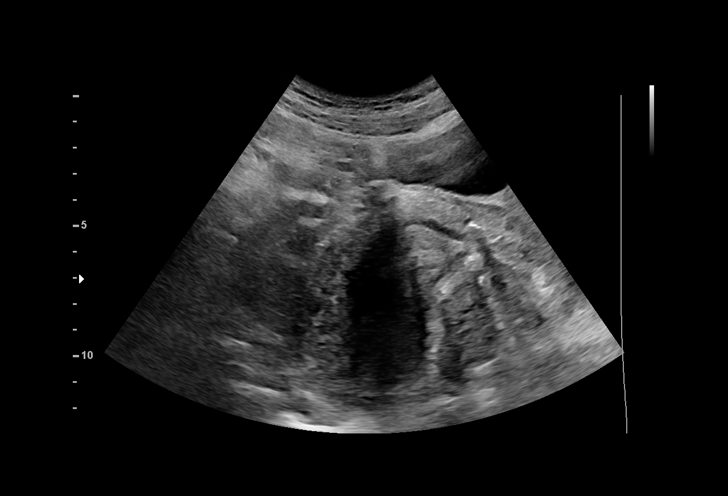
[im 4/52]
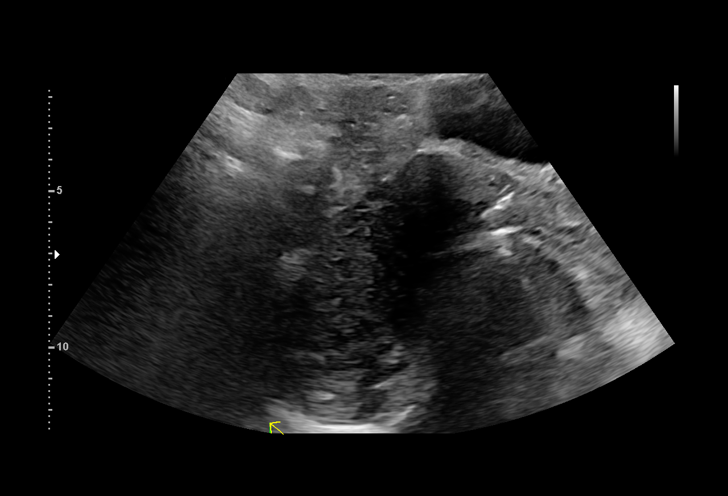
[im 8/52]
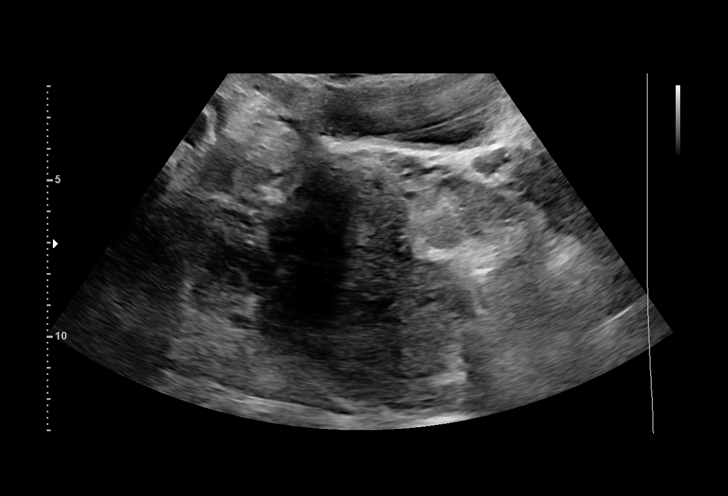
[im 12/52]
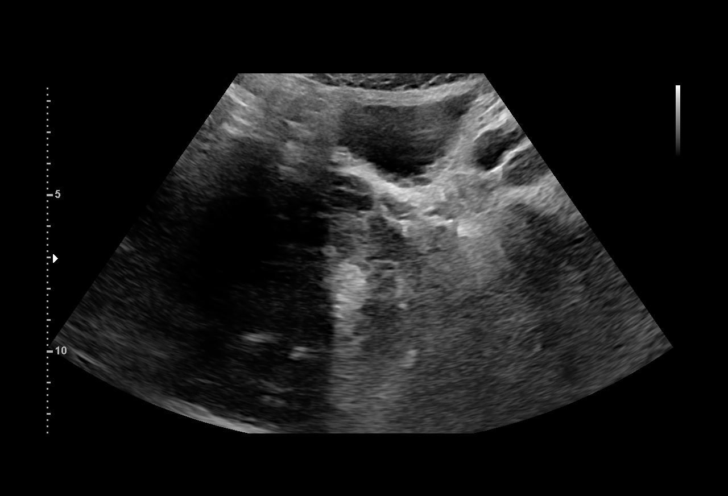
[im 16/52]
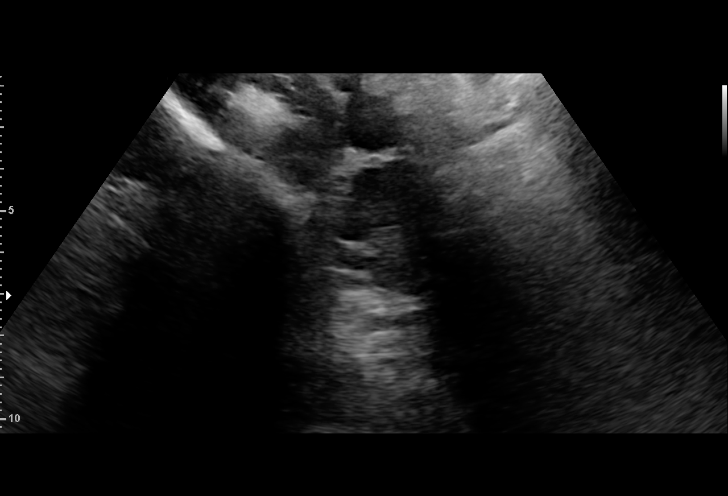
[im 19/52]
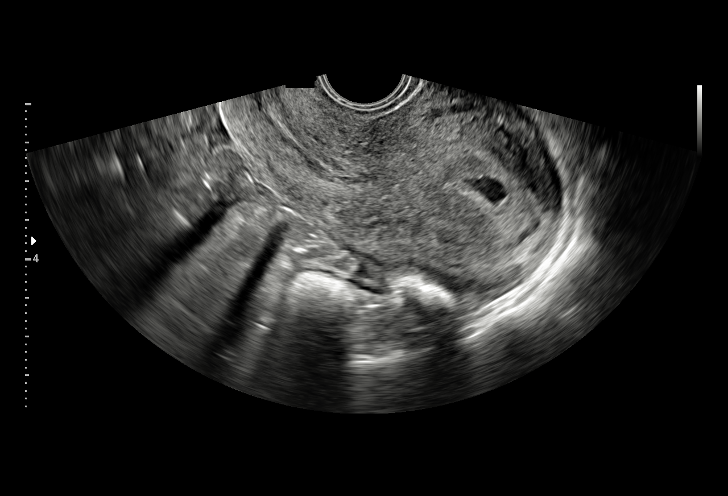
[im 23/52]
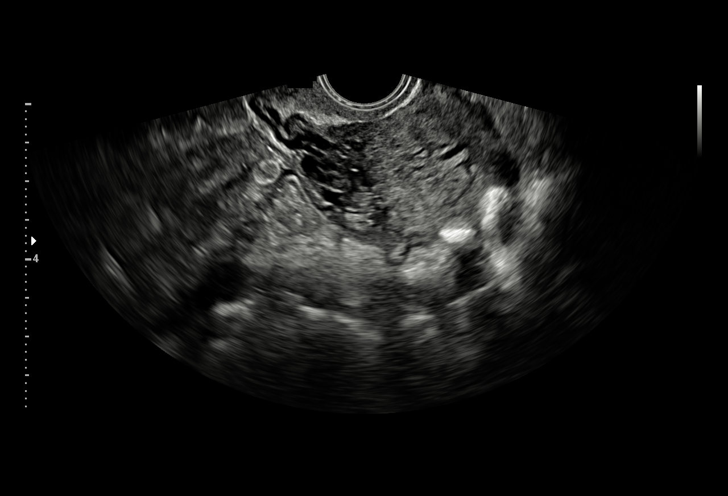
[im 27/52]
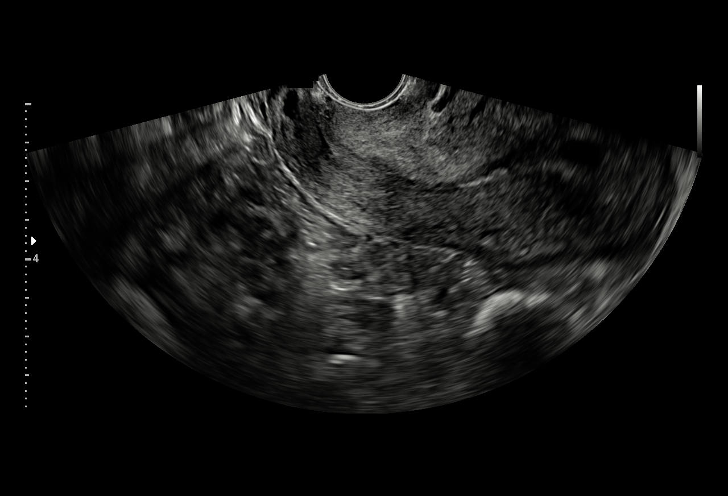
[im 29/52]
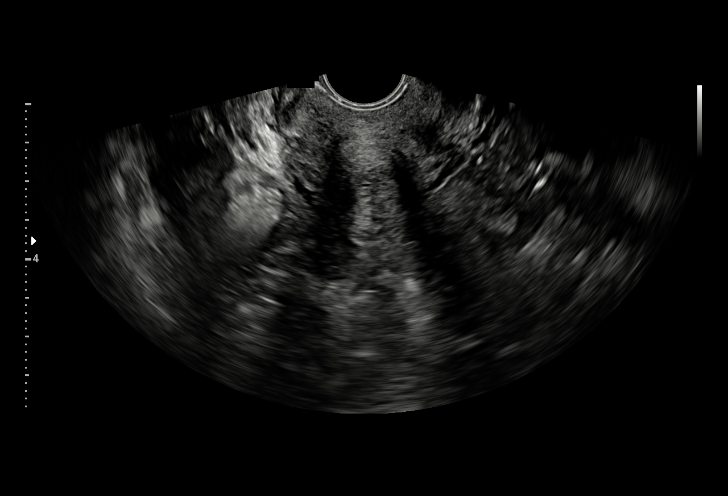
[im 33/52]
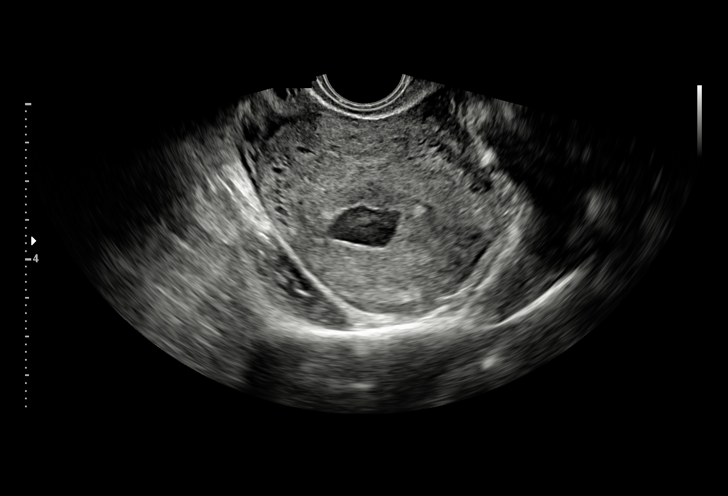
[im 36/52]
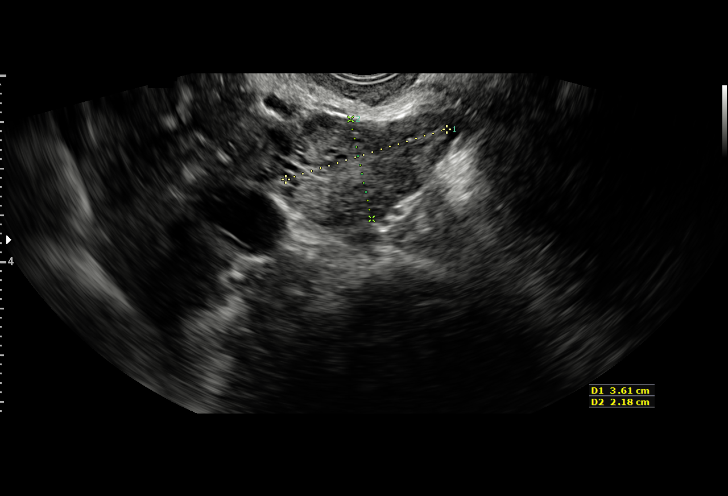
[im 40/52]
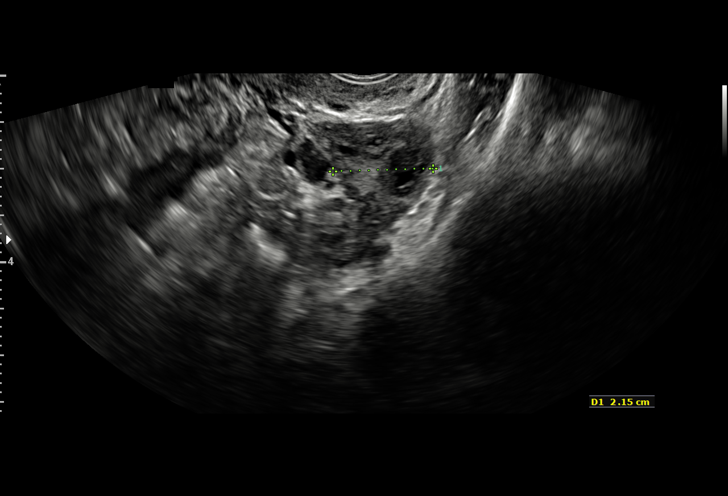
[im 44/52]
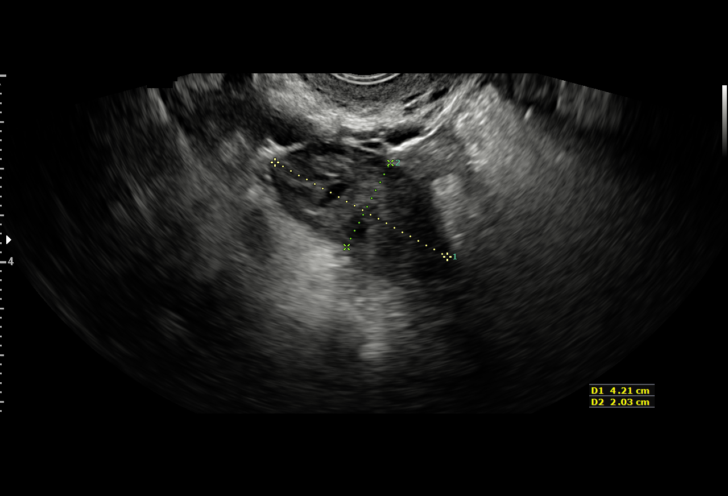
[im 48/52]
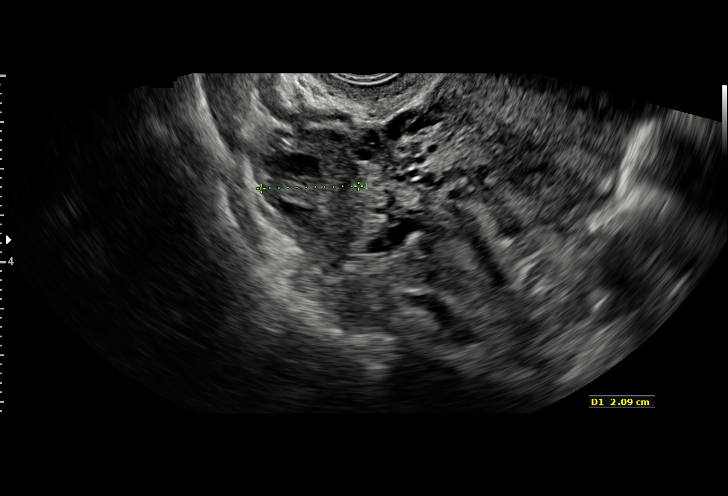
[im 52/52]
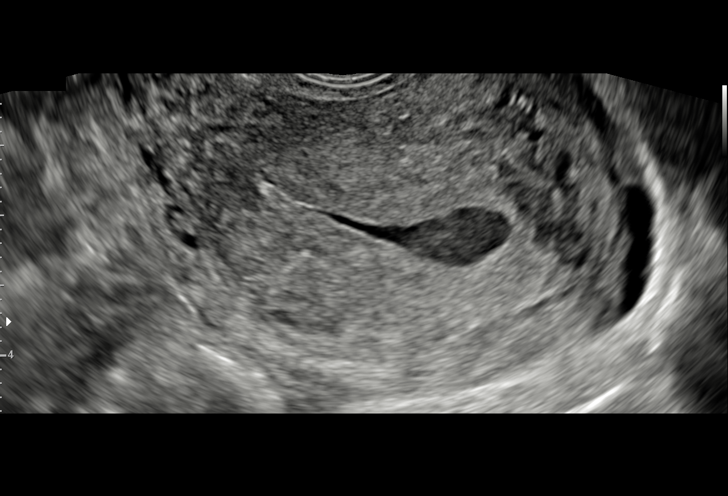

[15 of 28 positions shown; findings below may reference images not displayed]

FINDINGS: Intrauterine gestational sac: None seen.

Yolk sac:  N/A

Embryo:  N/A

Subchorionic hemorrhage:  None visualized.

Maternal uterus/adnexae: The uterus is grossly unremarkable in
appearance, aside from a small amount of complex fluid within the
endometrial canal.

The ovaries are normal in appearance. The right ovary measures 4.2 x
2.0 x 2.1 cm, while the left ovary measures 3.6 x 2.2 x 2.2 cm. No
suspicious adnexal masses are seen; there is no evidence for ovarian
torsion.

Trace free fluid is seen within the pelvic cul-de-sac.
IMPRESSION: 1. No intrauterine gestational sac seen. No evidence for ectopic
pregnancy at this time. Would correlate with the patient's
quantitative beta hCG level. If it continues to trend upward,
follow-up pelvic ultrasound could be performed in 2 weeks.
2. Small amount of complex fluid within the endometrial canal may
correspond to the patient's acute vaginal bleeding.

## 2017-12-15 ENCOUNTER — Encounter (HOSPITAL_COMMUNITY): Payer: Self-pay

## 2017-12-28 ENCOUNTER — Ambulatory Visit: Payer: Managed Care, Other (non HMO) | Admitting: Obstetrics and Gynecology

## 2018-01-20 ENCOUNTER — Ambulatory Visit (INDEPENDENT_AMBULATORY_CARE_PROVIDER_SITE_OTHER): Payer: Managed Care, Other (non HMO) | Admitting: Obstetrics and Gynecology

## 2018-01-20 ENCOUNTER — Encounter: Payer: Self-pay | Admitting: Obstetrics and Gynecology

## 2018-01-20 VITALS — BP 109/70 | HR 70 | Temp 97.9°F | Resp 16 | Wt 172.0 lb

## 2018-01-20 DIAGNOSIS — N760 Acute vaginitis: Secondary | ICD-10-CM | POA: Diagnosis not present

## 2018-01-20 DIAGNOSIS — Z0000 Encounter for general adult medical examination without abnormal findings: Secondary | ICD-10-CM

## 2018-01-20 DIAGNOSIS — Z01419 Encounter for gynecological examination (general) (routine) without abnormal findings: Secondary | ICD-10-CM

## 2018-01-20 DIAGNOSIS — B9689 Other specified bacterial agents as the cause of diseases classified elsewhere: Secondary | ICD-10-CM

## 2018-01-20 DIAGNOSIS — N898 Other specified noninflammatory disorders of vagina: Secondary | ICD-10-CM

## 2018-01-20 DIAGNOSIS — E049 Nontoxic goiter, unspecified: Secondary | ICD-10-CM

## 2018-01-20 NOTE — Progress Notes (Signed)
GYNECOLOGY CLINIC ANNUAL PREVENTATIVE CARE ENCOUNTER NOTE  Subjective:   Debbie Greene is a 32 y.o. [phone] female here for a routine annual gynecologic exam.  Current complaints: desiring LARC (considering Mirena or Skyla IUD).   Denies abnormal vaginal bleeding, discharge, pelvic pain, problems with intercourse or other gynecologic concerns.    Gynecologic History Patient's last menstrual period was 12/28/2017 (exact date). Contraception: none Last Pap: 10/01/2016. Results were: normal Last mammogram: N/A. Results were: N/A  Obstetric History OB History  Gravida Para Term Preterm AB Living  5 1 1   3 1   SAB TAB Ectopic Multiple Live Births    3     1    # Outcome Date GA Lbr Len/2nd Weight Sex Delivery Anes PTL Lv  5 Gravida           4 Term 06/04/08 [redacted]w[redacted]d  7 lb 2 oz (3.232 kg) M Vag-Spont EPI  LIV  3 TAB           2 TAB           1 TAB             No past medical history on file.  Past Surgical History:  Procedure Laterality Date  . THERAPEUTIC ABORTION     Pt states that she has had 2 abortions.     Current Outpatient Medications on File Prior to Visit  Medication Sig Dispense Refill  . valACYclovir (VALTREX) 1000 MG tablet Take 1 tablet (1,000 mg total) by mouth daily. 5 tablet 10   No current facility-administered medications on file prior to visit.     No Known Allergies  Social History   Socioeconomic History  . Marital status: Single    Spouse name: Not on file  . Number of children: Not on file  . Years of education: Not on file  . Highest education level: Not on file  Occupational History  . Not on file  Social Needs  . Financial resource strain: Not on file  . Food insecurity:    Worry: Not on file    Inability: Not on file  . Transportation needs:    Medical: Not on file    Non-medical: Not on file  Tobacco Use  . Smoking status: Never Smoker  . Smokeless tobacco: Never Used  Substance and Sexual Activity  . Alcohol use: Yes   Comment: Occassionally.   . Drug use: No  . Sexual activity: Yes    Partners: Male    Birth control/protection: None  Lifestyle  . Physical activity:    Days per week: Not on file    Minutes per session: Not on file  . Stress: Not on file  Relationships  . Social connections:    Talks on phone: Not on file    Gets together: Not on file    Attends religious service: Not on file    Active member of club or organization: Not on file    Attends meetings of clubs or organizations: Not on file    Relationship status: Not on file  . Intimate partner violence:    Fear of current or ex partner: Not on file    Emotionally abused: Not on file    Physically abused: Not on file    Forced sexual activity: Not on file  Other Topics Concern  . Not on file  Social History Narrative  . Not on file    Family History  Problem Relation Age of Onset  . Cancer  Maternal Grandmother        Breast    The following portions of the patient's history were reviewed and updated as appropriate: allergies, current medications, past family history, past medical history, past social history, past surgical history and problem list.  Review of Systems A comprehensive review of systems was negative.   Objective:  BP 109/70 (BP Location: Left Arm, Patient Position: Sitting, Cuff Size: Large)   Pulse 70   Temp 97.9 F (36.6 C) (Oral)   Resp 16   Wt 172 lb (78 kg)   LMP 12/28/2017 (Exact Date)   Breastfeeding? No   BMI 26.15 kg/m  CONSTITUTIONAL: Well-developed, well-nourished female in no acute distress.  HENT:  Normocephalic, atraumatic, External right and left ear normal. Oropharynx is clear and moist EYES: Conjunctivae and EOM are normal. Pupils are equal, round, and reactive to light. No scleral icterus.  NECK: Normal range of motion, supple, no masses.  Slightly enlarged thyroid.  SKIN: Skin is warm and dry. No rash noted. Not diaphoretic. No erythema. No pallor. NEUROLGIC: Alert and oriented to  person, place, and time. Normal reflexes, muscle tone coordination. No cranial nerve deficit noted. PSYCHIATRIC: Normal mood and affect. Normal behavior. Normal judgment and thought content. CARDIOVASCULAR: Normal heart rate noted, regular rhythm RESPIRATORY: Clear to auscultation bilaterally. Effort and breath sounds normal, no problems with respiration noted. BREASTS: Symmetric in size. No masses, skin changes, nipple drainage, or lymphadenopathy. ABDOMEN: Soft, normal bowel sounds, no distention noted.  No tenderness, rebound or guarding.  PELVIC: Normal appearing external genitalia; normal appearing vaginal mucosa and cervix.  No abnormal discharge noted.  Pap smear obtained.  Normal uterine size, no other palpable masses, no uterine or adnexal tenderness. MUSCULOSKELETAL: Normal range of motion. No tenderness.  No cyanosis, clubbing, or edema.  2+ distal pulses.   Assessment:  Annual gynecologic examination with pap smear   Plan:   No pap smear done today according to ASCCP guidelines - pt notified Will call to schedule IUD insertion for light day of menses Routine preventative health maintenance measures emphasized. Will draw Thyroid panel and refer to PCP, if abnormal.  Patient did not have drawn prior to leaving office -- order placed outpatient, can be done when she comes in for IUD insertion. Please refer to After Visit Summary for other counseling recommendations.   Raelyn MoraRolitta Kendria Halberg, CNM  01/20/2018 10:29 AM

## 2018-01-24 LAB — CERVICOVAGINAL ANCILLARY ONLY
Bacterial vaginitis: POSITIVE — AB
CANDIDA VAGINITIS: NEGATIVE

## 2018-01-24 MED ORDER — METRONIDAZOLE 500 MG PO TABS: 500.0000 mg | ORAL_TABLET | Freq: Two times a day (BID) | ORAL | 0 refills | Status: AC

## 2018-01-24 NOTE — Addendum Note (Signed)
Addended by: Kenard GowerAWSON, Veora Fonte M on: 01/24/2018 03:47 PM   Modules accepted: Orders

## 2018-01-25 ENCOUNTER — Telehealth: Payer: Self-pay

## 2018-01-25 NOTE — Telephone Encounter (Signed)
-----   Message from [place]itta Dawson, [place] sent at 01/24/2018  3:47 PM EDT ----- Call the patient to inform her of her results, a Rx for Flagyl 500 mg po BID x 7 days, Disp #14, no refills has been sent to her pharmacy on file and that she is to take the antibiotics as prescribed to treat the infection. Also, advise her that she is NOT to consume any alcohol or alcohol containing products while taking this medication. It will cause severe nausea and vomiting, if she does.

## 2018-01-25 NOTE — Telephone Encounter (Signed)
Patient notified of results and Rx, she verbalized understanding.

## 2018-01-31 ENCOUNTER — Ambulatory Visit: Payer: Managed Care, Other (non HMO) | Admitting: Obstetrics & Gynecology

## 2018-03-31 ENCOUNTER — Other Ambulatory Visit: Payer: Self-pay

## 2018-03-31 ENCOUNTER — Telehealth: Payer: Self-pay

## 2018-03-31 DIAGNOSIS — B009 Herpesviral infection, unspecified: Secondary | ICD-10-CM

## 2018-03-31 MED ORDER — VALACYCLOVIR HCL 1 G PO TABS: 1000.0000 mg | ORAL_TABLET | Freq: Every day | ORAL | 10 refills | Status: DC

## 2018-03-31 NOTE — Progress Notes (Signed)
Pt called stating [facility] does not have valtrex in stock. [facility]. I advised her I would do this.

## 2018-03-31 NOTE — Telephone Encounter (Signed)
Pt called wanting refill on valtrex. Pt was just seen in office in April for annual. Refilled valtrex for a year. Pt notified.

## 2018-11-08 ENCOUNTER — Encounter (HOSPITAL_COMMUNITY): Payer: Self-pay

## 2018-11-08 ENCOUNTER — Ambulatory Visit (HOSPITAL_COMMUNITY)
Admission: EM | Admit: 2018-11-08 | Discharge: 2018-11-08 | Disposition: A | Discharge status: Home / Self Care | Payer: BLUE CROSS/BLUE SHIELD | Attending: Family Medicine | Admitting: Family Medicine

## 2018-11-08 DIAGNOSIS — K219 Gastro-esophageal reflux disease without esophagitis: Secondary | ICD-10-CM | POA: Diagnosis not present

## 2018-11-08 DIAGNOSIS — A048 Other specified bacterial intestinal infections: Secondary | ICD-10-CM

## 2018-11-08 LAB — POCT H PYLORI SCREEN: H. PYLORI SCREEN, POC: POSITIVE — AB

## 2018-11-08 MED ORDER — METRONIDAZOLE 500 MG PO TABS: 500.0000 mg | ORAL_TABLET | Freq: Three times a day (TID) | ORAL | 0 refills | Status: AC

## 2018-11-08 MED ORDER — CLARITHROMYCIN 500 MG PO TABS: 500.0000 mg | ORAL_TABLET | Freq: Two times a day (BID) | ORAL | 0 refills | Status: AC

## 2018-11-08 MED ORDER — OMEPRAZOLE 20 MG PO CPDR: 20.0000 mg | DELAYED_RELEASE_CAPSULE | Freq: Two times a day (BID) | ORAL | 0 refills | Status: DC

## 2018-11-08 NOTE — ED Triage Notes (Signed)
Pt presents with rib pain on right side that radiates up to central chest area. Pt also complains of ongoing very bad headache.  Pain level 4 Today  Pain level 10 At Worse

## 2018-11-08 NOTE — ED Provider Notes (Signed)
[facility]   [id number] 11/08/18 Arrival Time: 0808  CC: ABDOMINAL DISCOMFORT  SUBJECTIVE:  Debbie Greene is a 33 y.o. female who presents with complaint of worsening intermittent abdominal discomfort x 1 year.  Recent flare of symptoms within the past week.  Denies a precipitating event, or trauma.  Localizes pain to RUQ and radiates towards the epigastric region.  Has tried OTC gas x with intermittent relief.  Denies aggravating factors.  Denies association with bowel movements or eating food.  Has been work-uped for similar symptoms in the past and told it was gas.  Negative [place] for gall stones. Also complains of mild associated headache, that has improved with OTC medication.   Denies fever, chills, appetite changes, nausea, vomiting, chest pain, SOB, diarrhea, constipation, hematochezia, melena, dysuria, difficulty urinating, increased frequency or urgency, flank pain, loss of bowel or bladder function.  Patient's last menstrual period was 10/29/2018.  ROS: As per HPI.  History reviewed. No pertinent past medical history. Past Surgical History:  Procedure Laterality Date  . THERAPEUTIC ABORTION     Pt states that she has had 2 abortions.    No Known Allergies No current facility-administered medications on file prior to encounter.    Current Outpatient Medications on File Prior to Encounter  Medication Sig Dispense Refill  . valACYclovir (VALTREX) 1000 MG tablet Take 1 tablet (1,000 mg total) by mouth daily. 30 tablet 10   Social History   Socioeconomic History  . Marital status: Single    Spouse name: Not on file  . Number of children: Not on file  . Years of education: Not on file  . Highest education level: Not on file  Occupational History  . Not on file  Social Needs  . Financial resource strain: Not on file  . Food insecurity:    Worry: Not on file    Inability: Not on file  . Transportation needs:    Medical: Not on file    Non-medical: Not on  file  Tobacco Use  . Smoking status: Never Smoker  . Smokeless tobacco: Never Used  Substance and Sexual Activity  . Alcohol use: Yes    Comment: Occassionally.   . Drug use: No  . Sexual activity: Yes    Partners: Male    Birth control/protection: None  Lifestyle  . Physical activity:    Days per week: Not on file    Minutes per session: Not on file  . Stress: Not on file  Relationships  . Social connections:    Talks on phone: Not on file    Gets together: Not on file    Attends religious service: Not on file    Active member of club or organization: Not on file    Attends meetings of clubs or organizations: Not on file    Relationship status: Not on file  . Intimate partner violence:    Fear of current or ex partner: Not on file    Emotionally abused: Not on file    Physically abused: Not on file    Forced sexual activity: Not on file  Other Topics Concern  . Not on file  Social History Narrative  . Not on file   Family History  Problem Relation Age of Onset  . Cancer Maternal Grandmother        Breast     OBJECTIVE:  Vitals:   11/08/18 0832  BP: 112/76  Pulse: 67  Resp: 18  Temp: 97.9 F (36.6 C)  TempSrc:  Oral  SpO2: 94%    General appearance: Alert; NAD HEENT: NCAT.  Oropharynx clear.  Lungs: clear to auscultation bilaterally without adventitious breath sounds Heart: regular rate and rhythm.  Radial pulses 2+ symmetrical bilaterally Abdomen: soft, non-distended; normal active bowel sounds; mildly tender over the periumbilical region with deep palpation; nontender at McBurney's point; negative Murphy's sign; negative rebound; no guarding Back: no CVA tenderness Extremities: no edema; symmetrical with no gross deformities Skin: warm and dry Neurologic: normal gait Psychological: alert and cooperative; normal mood and affect  LABS: Results for orders placed or performed during the hospital encounter of 11/08/18 (from the past 24 hour(s))  H.pylori  screen, POC     Status: Abnormal   Collection Time: 11/08/18  9:06 AM  Result Value Ref Range   H. PYLORI SCREEN, POC POSITIVE (A) NEGATIVE     ASSESSMENT & PLAN:  1. Gastroesophageal reflux disease, esophagitis presence not specified   2. Positive H. pylori test     Meds ordered this encounter  Medications  . omeprazole (PRILOSEC) 20 MG capsule    Sig: Take 1 capsule (20 mg total) by mouth 2 (two) times daily before a meal for 14 days.    Dispense:  28 capsule    Refill:  0    Order Specific Question:   Supervising Provider    Answer:   Eustace MooreNELSON, YVONNE SUE [[id number]]  . clarithromycin (BIAXIN) 500 MG tablet    Sig: Take 1 tablet (500 mg total) by mouth 2 (two) times daily for 14 days.    Dispense:  28 tablet    Refill:  0    Order Specific Question:   Supervising Provider    Answer:   Eustace MooreNELSON, YVONNE SUE [[id number]]  . metroNIDAZOLE (FLAGYL) 500 MG tablet    Sig: Take 1 tablet (500 mg total) by mouth 3 (three) times daily for 14 days.    Dispense:  42 tablet    Refill:  0    Order Specific Question:   Supervising Provider    Answer:   Eustace MooreELSON, YVONNE SUE [[id number]]    H. Pylori testing positive in office Treated for H. Pylori today with clarithromycin, metronidazole, and omeprazole.  Take medications as directed and to completion.   Avoid eating 2-3 hours before bed Elevate head bed.  Avoid chocolate, caffeine, alcohol, onion, and mint prior to bed.  This relaxes the bottom part of your esophagus and can make your symptoms worse.  Follow up with PCP for further evaluation and management of symptoms Return or go to the ED if you have any new or worsening symptoms fever, chills, abdominal pain, changes in bowel or bladder habits, nausea, vomiting, severe 10/10 abdominal pain, etc...   Reviewed expectations re: course of current medical issues. Questions answered. Outlined signs and symptoms indicating need for more acute intervention. Patient verbalized understanding. After  Visit Summary given.   Rennis HardingWurst, Jalayna Josten, PA-C 11/08/18 1050

## 2018-11-08 NOTE — Discharge Instructions (Addendum)
H. Pylori testing ordered today.  We will follow up with you regarding abnormal results.   Treated for H. Pylori today with clarithromycin, metronidazole, and omeprazole.  Take medications as directed and to completion.   Avoid eating 2-3 hours before bed Elevate head bed.  Avoid chocolate, caffeine, alcohol, onion, and mint prior to bed.  This relaxes the bottom part of your esophagus and can make your symptoms worse.  Follow up with PCP for further evaluation and management Return or go to the ED if you have any new or worsening symptoms fever, chills, abdominal pain, changes in bowel or bladder habits, nausea, vomiting, severe 10/10 abdominal pain, etc..Marland Kitchen

## 2018-12-15 ENCOUNTER — Other Ambulatory Visit (HOSPITAL_COMMUNITY)
Admission: RE | Admit: 2018-12-15 | Discharge: 2018-12-15 | Disposition: A | Discharge status: Home / Self Care | Payer: BLUE CROSS/BLUE SHIELD | Source: Ambulatory Visit | Attending: Family Medicine | Admitting: Family Medicine

## 2018-12-15 ENCOUNTER — Other Ambulatory Visit: Payer: Self-pay

## 2018-12-15 ENCOUNTER — Encounter: Payer: Self-pay | Admitting: Family Medicine

## 2018-12-15 ENCOUNTER — Ambulatory Visit (INDEPENDENT_AMBULATORY_CARE_PROVIDER_SITE_OTHER): Payer: BLUE CROSS/BLUE SHIELD | Admitting: Family Medicine

## 2018-12-15 VITALS — BP 120/75 | HR 67 | Temp 98.1°F | Ht 67.75 in | Wt 174.0 lb

## 2018-12-15 DIAGNOSIS — G44219 Episodic tension-type headache, not intractable: Secondary | ICD-10-CM | POA: Diagnosis not present

## 2018-12-15 DIAGNOSIS — Z87898 Personal history of other specified conditions: Secondary | ICD-10-CM

## 2018-12-15 DIAGNOSIS — Z7251 High risk heterosexual behavior: Secondary | ICD-10-CM

## 2018-12-15 DIAGNOSIS — Z124 Encounter for screening for malignant neoplasm of cervix: Secondary | ICD-10-CM | POA: Diagnosis not present

## 2018-12-15 DIAGNOSIS — M2550 Pain in unspecified joint: Secondary | ICD-10-CM | POA: Diagnosis not present

## 2018-12-15 DIAGNOSIS — D649 Anemia, unspecified: Secondary | ICD-10-CM

## 2018-12-15 DIAGNOSIS — R1013 Epigastric pain: Secondary | ICD-10-CM

## 2018-12-15 DIAGNOSIS — B009 Herpesviral infection, unspecified: Secondary | ICD-10-CM | POA: Insufficient documentation

## 2018-12-15 NOTE — Patient Instructions (Signed)
It was great to meet you today,  We do not need to do any blood work.  I will let you know when I get the results of the pap smear.    For your headaches, joint pain, and nose bleeds, they do not appear to be at the point where they are interfering with your daily life so I don't think we need to do anything about them right now.  Continue to take ibuprofen or tylenol for your headaches.  They may help with your joint pain as well.  Due to your nose bleeds, I would prefer you try tylenol before advil or ibuprofen.      Here is some info about Nexplenon.   Contraceptive Implant Information A contraceptive implant is a small, plastic rod that is inserted under the skin. The implant releases a hormone into the bloodstream that prevents pregnancy. Contraceptive implants can be effective for up to 3 years. They do not provide protection against STIs (sexually transmitted infections). How does the implant work? Contraceptive implants prevent pregnancy by releasing a small amount of progestin into the bloodstream. Progestin has similar effects to the hormone progesterone, which plays a role in menstrual periods and pregnancy. Progestin will:  Stop the ovaries from releasing eggs.  Thicken cervical mucus to prevent sperm from entering the cervix.  Thin out the lining of the uterus to prevent a fertilized egg from attaching to the wall of the uterus. What are the advantages of this form of birth control? The advantages of this form of birth control include the following:  It is very effective at preventing pregnancy.  It is effective for up to 3 years.  It can easily be removed.  It does not interfere with sex or daily activities.  It can be used when breastfeeding.  It can be used by women who cannot take estrogen.  The procedure to insert the device is quick.  Women can get pregnant shortly after removing the device. What are the disadvantages of this form of birth control? The  disadvantages of this form of birth control include the following:  It can cause side effects, including: ? Irregular menstrual periods or bleeding. ? Headache. ? Weight gain. ? Acne. ? Breast tenderness. ? Abdomen (abdominal) pain. ? Mood changes, such as depression.  It does not protect against STIs.  You must make an office visit to have it inserted and removed by a trained clinician.  Inserting or removing the device can result in pain, scarring, and tissue or nerve damage (rare). How is this implant inserted? The procedure to insert an implant only takes a few minutes. During the procedure:  Your upper arm will be numbed with a numbing medicine (local anesthetic).  The implant will be injected under the skin of your upper arm with a needle. After the procedure:  You may experience minor bruising, swelling, or discomfort at the insertion site. This should only last for a couple of days.  You may need to use another, non-hormonal contraceptive such as a condom for 7 days after the procedure. How is the implant removed? The implant should be removed after 3 years or as directed by your health care provider. The procedure to remove the implant only takes a few minutes. During this procedure:  Your upper arm will be numbed with a local anesthetic.  A small incision will be made near the implant.  The implant will be removed with a small pair of forceps. After the implant is removed:  The effect of the implant will wear off a few hours after removal. Most women will be able to get pregnant within 3 weeks of removal.  A new implant can be inserted as soon as the old one is removed, if desired.  You may experience minor bruising, swelling, or discomfort at the removal site. This should only last for a couple of days. Is this implant right for me? Your health care provider can help you determine whether you are good candidate for a contraceptive implant. Make sure to discuss  the possible side effects with your health care provider. You should not get the implant if you:  Are pregnant.  Are allergic to any part of the implant.  Have a history of: ? Breast cancer. ? Unusual bleeding from the vagina. ? Heart disease. ? Stroke. ? Liver disease or tumors. ? Migraines. Summary  A contraceptive implant is a small, plastic rod that is inserted under the skin. The implant releases a hormone into the bloodstream that prevents pregnancy.  Contraceptive implants can be effective for up to 3 years.  The implant works by preventing ovaries from releasing eggs, thickening the cervical mucus, and thinning the uterine wall.  This form of birth control is very effective at preventing pregnancy and can be inserted and removed quickly. Women can get pregnant shortly after the device is removed.  This form of birth control can cause some side effects, including weight gain, breast tenderness, headaches, irregular periods or bleeding, acne, abdominal pain, and depression. It does not provide protection against STIs (sexually transmitted infections). This information is not intended to replace advice given to you by your health care provider. Make sure you discuss any questions you have with your health care provider. Document Released: 09/23/2011 Document Revised: 09/18/2016 Document Reviewed: 09/18/2016 Elsevier Interactive Patient Education  2019 [employer].

## 2018-12-15 NOTE — Assessment & Plan Note (Signed)
Patient states she has had episodes of nosebleeds since she was a child.  These happen once every few months.  She does not pick her nose.  She states that she pinches her nose and holds her head back.  Advised patient to lean head forward if this episode happens in the future.  Most likely due to blood vessel near the surface that occasionally gets irritated and ruptures.  Nothing abnormal seen on exam.  We will continue to monitor.

## 2018-12-15 NOTE — Assessment & Plan Note (Signed)
Patient in a monogamous relationship.  They are not using condoms, nor is she on contraception.  Explained to patient what the Nexplanon was, advised her to start birth control if she does not intend to get pregnant.

## 2018-12-15 NOTE — Assessment & Plan Note (Signed)
Headaches do not sound like migraines.  They are not affecting her work or life.  They occur once or twice a month.  Most likely tension type headache.  We will continue to monitor.

## 2018-12-15 NOTE — Assessment & Plan Note (Addendum)
Patient stated she had right upper quadrant pain a few weeks ago.  This pain is since resolved.  She did not state it was associated with eating.  Patient went to urgent care and there was a subsequent positive H. pylori test.  Patient was given PPI.  Clarithromycin, and metronidazole at urgent care which she states she only took for 1 day due to making her feel bad.  Told patient that she can choose to take the other 13 days worth of medicine if she feels like she can tolerate the side effects.  If she develops symptoms again we can always try a different combination of therapy.

## 2018-12-15 NOTE — Assessment & Plan Note (Signed)
Patient states she has been anemic since her first child.  Hemoglobin has been 10 or below on several occasions in the past.  MCV is in the high 70s.  CBC and anemia panel ordered.

## 2018-12-15 NOTE — Assessment & Plan Note (Addendum)
Last Pap smear was 3 years ago.  Patient states that it was normal.  On exam, some blood was noted in the vaginal vault, along with nonfriable white patches along the cervix.  Patient was not reported to the menstruating at the time.  GC chlamydia and trichomonas were added to Pap.  Will contact patient with results.

## 2018-12-15 NOTE — Assessment & Plan Note (Signed)
Patient complains of occasional soreness in her right wrist, right elbow, right knee.  There is no swelling.  She does not complain of fever.  There is no rash.  Unlikely infectious or autoimmune.  Is not currently negatively affecting her life.  We will continue to monitor.

## 2018-12-15 NOTE — Progress Notes (Signed)
[facility] Phone: [phone]   cc: New patient visit, headache, joint pain, nosebleeds  Subjective:  Patient is a new patient to this clinic, who did not receive much primary care prior to this.  She works at [employer].  She lives with her son who is 31 years old.  She has no relationship with the son's father.  She is currently dating someone.  She was in a monogamous relationship.  They do not use birth control or condoms.  Headaches: Patient describes a pressure behind both of her eyes, that can last up to a whole day.  This happens a few times a month.  Sometimes she will take a pain pill or go to sleep.  Sleep does not make it better.  She denies photophobia fatigue aura.  She does not describe the headache as one-sided or pounding.   Joint pain: The pain is right-sided, affecting the wrist, elbow, and knee.  She states she had fractured her knee as a child but she does not know which one.  This pain is usually felt 2-3 times a month and lasting usually less than 1 day.  She states her mom has some kind of arthritis, but that she is big and her weight may have something to do with it.  Patient does not have any associated fevers or rashes with this joint pain.  She also denies inflammation.  Nose bleeds: Patient has had nosebleeds since she was a child.  She states she does not pick her nose or blow her nose.  She states these nosebleeds are random and happen maybe once a month or every other month.  When she gets them she holds her nose and tilts her head back until it goes away. ts her head back.  No fatigue.      Anemia: Patient states she has been anemic since her first child.  Patient does not have fatigue, dizziness.  Abdominal pain: Patient stated that she was having some right upper quadrant pain several weeks ago but that this pain had resolved.  She stated this pain was not associated with eating.  Health Maintenance: She last had a Pap smear  in 2017, which she stated was normal.  ROS: Patient denies sore throat, cough, chest pain, abdominal pain, vomiting, diarrhea, constipation, dizziness,  Medical History: Patient states she took Depakote and Risperdal when she was "in the system" as a child.  She has not taken any of these medication in 12 years   Surgical History: no surgeries.   Obstetric History: Spontaneous vaginal term delivery  Social History:  Work: Works at a call center Relationship: Monogamous relationship Sexually active?:  Yes Contraceptive use: No.  Patient states she would like to be on contraception at this point.  Patient did not know about Nexplanon.  Smoking: non-smoker Other Tobacco: no Alcohol: social, maybe a glass of wine if she's out Drug use: N/A   Objective: BP 120/75   Pulse 67   Temp 98.1 F (36.7 C) (Oral)   Ht 5' 7.75" (1.721 m)   Wt 174 lb (78.9 kg)   LMP 11/22/2018   SpO2 99%   BMI 26.65 kg/m  Body mass index is 26.65 kg/m. Gen: NAD, alert and oriented, cooperative with exam HEENT: NCAT, EOMI, MMM, no scleral icterus Neck: FROM, supple, no masses CV: normal rate, regular rhythm. No murmurs, no rubs.  Resp: LCTAB, no wheezes, crackles. normal work of breathing GI: nontender to palpation, BS present, no  guarding or organomegaly GU: No lesions noted on the labia or vaginal vault.  Minimal amount of blood noted in the vagina surrounding the cervix.  Cervix was notable for whitish lesion approximately 2 mm in diameter along the outside 2 o'clock position.  Does not appear to be an ulceration.  Not friable Msk: No edema, warm, normal tone, moves UE/LE spontaneously Neuro: CN II-XII grossly intact. no gross deficits Skin: No rashes, no lesions Psych: Appropriate behavior  Assessment/Plan: Screening for cervical cancer Last Pap smear was 3 years ago.  Patient states that it was normal.  On exam, some blood was noted in the vaginal vault, along with nonfriable white patches along  the cervix.  Patient was not reported to the menstruating at the time.  GC chlamydia and trichomonas were added to Pap.  Will contact patient with results.  Unprotected sexual intercourse Patient in a monogamous relationship.  They are not using condoms, nor is she on contraception.  Explained to patient what the Nexplanon was, advised her to start birth control if she does not intend to get pregnant.  Anemia Patient states she has been anemic since her first child.  Hemoglobin has been 10 or below on several occasions in the past.  MCV is in the high 70s.  CBC and anemia panel ordered.  Episodic tension-type headache, not intractable Headaches do not sound like migraines.  They are not affecting her work or life.  They occur once or twice a month.  Most likely tension type headache.  We will continue to monitor.  Multiple joint pain Patient complains of occasional soreness in her right wrist, right elbow, right knee.  There is no swelling.  She does not complain of fever.  There is no rash.  Unlikely infectious or autoimmune.  Is not currently negatively affecting her life.  We will continue to monitor.  History of epistaxis Patient states she has had episodes of nosebleeds since she was a child.  These happen once every few months.  She does not pick her nose.  She states that she pinches her nose and holds her head back.  Advised patient to lean head forward if this episode happens in the future.  Most likely due to blood vessel near the surface that occasionally gets irritated and ruptures.  Nothing abnormal seen on exam.  We will continue to monitor.  Dyspepsia Patient stated she had right upper quadrant pain a few weeks ago.  This pain is since resolved.  She did not state it was associated with eating.  Patient went to urgent care and there was a subsequent positive H. pylori test.  Patient was given PPI.  Clarithromycin, and metronidazole at urgent care which she states she only took for 1  day due to making her feel bad.  Told patient that she can choose to take the other 13 days worth of medicine if she feels like she can tolerate the side effects.  If she develops symptoms again we can always try a different combination of therapy.     Frederic Jericho, MD PGY-1

## 2018-12-18 LAB — CBC WITH DIFFERENTIAL/PLATELET
Basophils Absolute: 0 10*3/uL (ref 0.0–0.2)
Basos: 1 %
EOS (ABSOLUTE): 0.1 10*3/uL (ref 0.0–0.4)
EOS: 3 %
HEMOGLOBIN: 11.1 g/dL (ref 11.1–15.9)
IMMATURE GRANULOCYTES: 0 %
Immature Grans (Abs): 0 10*3/uL (ref 0.0–0.1)
LYMPHS: 30 %
Lymphocytes Absolute: 1.2 10*3/uL (ref 0.7–3.1)
MCH: 27.3 pg (ref 26.6–33.0)
MCHC: 32.3 g/dL (ref 31.5–35.7)
MCV: 85 fL (ref 79–97)
MONOCYTES: 8 %
MONOS ABS: 0.3 10*3/uL (ref 0.1–0.9)
Neutrophils Absolute: 2.4 10*3/uL (ref 1.4–7.0)
Neutrophils: 58 %
PLATELETS: 293 10*3/uL (ref 150–450)
RBC: 4.07 x10E6/uL (ref 3.77–5.28)
RDW: 13.5 % (ref 11.7–15.4)
WBC: 4.1 10*3/uL (ref 3.4–10.8)

## 2018-12-18 LAB — ANEMIA PANEL
FERRITIN: 6 ng/mL — AB (ref 15–150)
FOLATE, RBC: 927 ng/mL (ref >498)
Folate, Hemolysate: 319 ng/mL
HEMATOCRIT: 34.4 % (ref 34.0–46.6)
Iron Saturation: 15 % (ref 15–55)
Iron: 59 ug/dL (ref 27–159)
RETIC CT PCT: 1.2 % (ref 0.6–2.6)
Total Iron Binding Capacity: 389 ug/dL (ref 250–450)
UIBC: 330 ug/dL (ref 131–425)
VITAMIN B 12: 617 pg/mL (ref 232–1245)

## 2018-12-19 ENCOUNTER — Encounter: Payer: Self-pay | Admitting: Family Medicine

## 2018-12-19 LAB — CYTOLOGY - PAP
Chlamydia: NEGATIVE
Diagnosis: NEGATIVE
HPV: NOT DETECTED
Neisseria Gonorrhea: NEGATIVE
Trichomonas: NEGATIVE

## 2018-12-19 NOTE — Progress Notes (Signed)
Sent letter to patient explaining bc pap and HPV were negative, she can repeat pap in 5 years.  Also recommended she take OTC iron given her low ferritin and previous history of low hemoglobin, along with borderline low Hgb on this test.

## 2019-07-19 ENCOUNTER — Emergency Department (HOSPITAL_COMMUNITY)
Admission: EM | Admit: 2019-07-19 | Discharge: 2019-07-19 | Disposition: A | Discharge status: Home / Self Care | Payer: BC Managed Care – PPO | Attending: Emergency Medicine | Admitting: Emergency Medicine

## 2019-07-19 ENCOUNTER — Other Ambulatory Visit: Payer: Self-pay

## 2019-07-19 ENCOUNTER — Encounter (HOSPITAL_COMMUNITY): Payer: Self-pay | Admitting: Emergency Medicine

## 2019-07-19 DIAGNOSIS — R109 Unspecified abdominal pain: Secondary | ICD-10-CM | POA: Insufficient documentation

## 2019-07-19 DIAGNOSIS — Z5321 Procedure and treatment not carried out due to patient leaving prior to being seen by health care provider: Secondary | ICD-10-CM | POA: Insufficient documentation

## 2019-07-19 LAB — COMPREHENSIVE METABOLIC PANEL
ALT: 12 U/L (ref 0–44)
AST: 15 U/L (ref 15–41)
Albumin: 4.4 g/dL (ref 3.5–5.0)
Alkaline Phosphatase: 44 U/L (ref 38–126)
Anion gap: 7 (ref 5–15)
BUN: 11 mg/dL (ref 6–20)
CO2: 28 mmol/L (ref 22–32)
Calcium: 9.1 mg/dL (ref 8.9–10.3)
Chloride: 104 mmol/L (ref 98–111)
Creatinine, Ser: 0.69 mg/dL (ref 0.44–1.00)
GFR calc Af Amer: >60 mL/min (ref >60)
GFR calc non Af Amer: >60 mL/min (ref >60)
Glucose, Bld: 98 mg/dL (ref 70–99)
Potassium: 3.3 mmol/L — ABNORMAL LOW (ref 3.5–5.1)
Sodium: 139 mmol/L (ref 135–145)
Total Bilirubin: 0.7 mg/dL (ref 0.3–1.2)
Total Protein: 7.1 g/dL (ref 6.5–8.1)

## 2019-07-19 LAB — I-STAT BETA HCG BLOOD, ED (MC, WL, AP ONLY): I-stat hCG, quantitative: <5 m[IU]/mL (ref <5)

## 2019-07-19 LAB — CBC
HCT: 34.2 % — ABNORMAL LOW (ref 36.0–46.0)
Hemoglobin: 10.8 g/dL — ABNORMAL LOW (ref 12.0–15.0)
MCH: 26.8 pg (ref 26.0–34.0)
MCHC: 31.6 g/dL (ref 30.0–36.0)
MCV: 84.9 fL (ref 80.0–100.0)
Platelets: 275 10*3/uL (ref 150–400)
RBC: 4.03 MIL/uL (ref 3.87–5.11)
RDW: 14.3 % (ref 11.5–15.5)
WBC: 4.6 10*3/uL (ref 4.0–10.5)
nRBC: 0 % (ref 0.0–0.2)

## 2019-07-19 LAB — LIPASE, BLOOD: Lipase: 17 U/L (ref 11–51)

## 2019-07-19 MED ORDER — SODIUM CHLORIDE 0.9% FLUSH: 3.0000 mL | Freq: Once | INTRAVENOUS | Status: DC

## 2019-07-19 NOTE — ED Triage Notes (Signed)
Pt c/o intermittent abd pains for several weeks. Having nausea but denies v/d or urinary problems.

## 2019-10-19 NOTE — L&D Delivery Note (Signed)
Delivery Note At  a viable female was delivered via  (Presentation:OA      ).  APGAR:9 ,9 ; weight pending  .   Placenta status:  complete,  . 3V Cord:   with the following complications:ROM x24 hours, increased bleeding noted after fundal massage and pitocin per IV. Given Methergine IM  .    Anesthesia:  Lidocaine 1% Episiotomy:  None Lacerations:   2nd degree Suture Repair: 2.0 vicryl Est. Blood Loss (mL):  150cc  Mom to postpartum.  Baby to Couplet care / Skin to Skin.  Debbie Greene 10/12/2020, 4:25 PM

## 2019-10-21 ENCOUNTER — Ambulatory Visit (HOSPITAL_COMMUNITY)
Admission: EM | Admit: 2019-10-21 | Discharge: 2019-10-21 | Disposition: A | Discharge status: Home / Self Care | Payer: BC Managed Care – PPO | Attending: Urgent Care | Admitting: Urgent Care

## 2019-10-21 ENCOUNTER — Other Ambulatory Visit: Payer: Self-pay

## 2019-10-21 ENCOUNTER — Encounter (HOSPITAL_COMMUNITY): Payer: Self-pay

## 2019-10-21 DIAGNOSIS — S0501XA Injury of conjunctiva and corneal abrasion without foreign body, right eye, initial encounter: Secondary | ICD-10-CM | POA: Diagnosis not present

## 2019-10-21 DIAGNOSIS — H5711 Ocular pain, right eye: Secondary | ICD-10-CM

## 2019-10-21 MED ORDER — TETRACAINE HCL 0.5 % OP SOLN
OPHTHALMIC | Status: AC
  Filled 2019-10-21: qty 4

## 2019-10-21 MED ORDER — TOBRAMYCIN 0.3 % OP SOLN: 2.0000 [drp] | OPHTHALMIC | 0 refills | Status: DC

## 2019-10-21 MED ORDER — NAPROXEN 500 MG PO TABS: 500.0000 mg | ORAL_TABLET | Freq: Two times a day (BID) | ORAL | 0 refills | Status: DC

## 2019-10-21 NOTE — Discharge Instructions (Signed)
Get in with an eye doctor asap. You have a corneal abrasion overlying the pupil and needs urgent follow up with the eye doctor. Get started with tobramycin antibiotic immediately.

## 2019-10-21 NOTE — ED Triage Notes (Signed)
Patient presents to Urgent Care with complaints of right eye pain since poking herself in the eye with her finger. Patient reports her eye is still very painful, cannot hold it open for a long period of time, states vision is not impacted.

## 2019-10-21 NOTE — ED Provider Notes (Signed)
[facility]   MRN: [id number] DOB: 06-25-1986  Subjective:   Debbie Greene is a 34 y.o. female presenting for acute onset worsening right eye pain with redness and watery eye after accidentally poking her eye. Denies vision change but has a hard time keeping her eye open.  Denies bleeding, drainage of pus, confusion, headache, nausea, vomiting, belly pain.  No current facility-administered medications for this encounter.  Current Outpatient Medications:  .  omeprazole (PRILOSEC) 20 MG capsule, Take 1 capsule (20 mg total) by mouth 2 (two) times daily before a meal for 14 days., Disp: 28 capsule, Rfl: 0 .  valACYclovir (VALTREX) 1000 MG tablet, Take 1 tablet (1,000 mg total) by mouth daily., Disp: 30 tablet, Rfl: 10   No Known Allergies  Past Medical History:  Diagnosis Date  . High risk sexual behavior 10/01/2016     Past Surgical History:  Procedure Laterality Date  . THERAPEUTIC ABORTION     Pt states that she has had 2 abortions.     Family History  Problem Relation Age of Onset  . Cancer Maternal Grandmother        Breast  . Asthma Mother   . Hypertension Mother     Social History   Tobacco Use  . Smoking status: Never Smoker  . Smokeless tobacco: Never Used  Substance Use Topics  . Alcohol use: Yes    Comment: Occassionally.   . Drug use: No    ROS   Objective:   Vitals: BP 117/79 (BP Location: Right Arm)   Pulse 83   Temp 98.7 F (37.1 C) (Oral)   Resp 16   SpO2 100%   Physical Exam Constitutional:      General: She is not in acute distress.    Appearance: Normal appearance. She is well-developed. She is not ill-appearing.  HENT:     Head: Normocephalic and atraumatic.     Nose: Nose normal.     Mouth/Throat:     Mouth: Mucous membranes are moist.     Pharynx: Oropharynx is clear.  Eyes:     General: No scleral icterus.    Extraocular Movements: Extraocular movements intact.     Pupils: Pupils are equal, round, and reactive to  light.   Cardiovascular:     Rate and Rhythm: Normal rate.  Pulmonary:     Effort: Pulmonary effort is normal.  Skin:    General: Skin is warm and dry.  Neurological:     General: No focal deficit present.     Mental Status: She is alert and oriented to person, place, and time.  Psychiatric:        Mood and Affect: Mood normal.        Behavior: Behavior normal.     Eye Exam: Eyelids everted and swept for foreign body. The eye was anesthetized with 2 drops of tetracaine and stained with fluorescein. Examination under woods lamp does reveal an area of increased stain uptake as depicted. The eye was then irrigated copiously with saline.   Assessment and Plan :   1. Abrasion of right cornea, initial encounter   2. Pain of right eye     We will start patient on tobramycin, use naproxen for pain and inflammation.  Emphasized need to follow-up with ophthalmology ASAP tomorrow.  Provided her with information to multiple ophthalmologist and recommend that she see the practice that can get her in the fastest. Counseled patient on potential for adverse effects with medications prescribed/recommended today, ER and  return-to-clinic precautions discussed, patient verbalized understanding.    Wallis Bamberg, PA-C 10/21/19 1910

## 2019-10-22 DIAGNOSIS — S0501XA Injury of conjunctiva and corneal abrasion without foreign body, right eye, initial encounter: Secondary | ICD-10-CM | POA: Diagnosis not present

## 2019-11-27 DIAGNOSIS — N921 Excessive and frequent menstruation with irregular cycle: Secondary | ICD-10-CM | POA: Diagnosis not present

## 2019-11-27 DIAGNOSIS — R109 Unspecified abdominal pain: Secondary | ICD-10-CM | POA: Diagnosis not present

## 2019-12-07 DIAGNOSIS — Z3202 Encounter for pregnancy test, result negative: Secondary | ICD-10-CM | POA: Diagnosis not present

## 2019-12-07 DIAGNOSIS — Z3141 Encounter for fertility testing: Secondary | ICD-10-CM | POA: Diagnosis not present

## 2019-12-27 DIAGNOSIS — N920 Excessive and frequent menstruation with regular cycle: Secondary | ICD-10-CM | POA: Diagnosis not present

## 2020-02-06 DIAGNOSIS — Z3169 Encounter for other general counseling and advice on procreation: Secondary | ICD-10-CM | POA: Diagnosis not present

## 2020-02-21 DIAGNOSIS — Z3200 Encounter for pregnancy test, result unknown: Secondary | ICD-10-CM | POA: Diagnosis not present

## 2020-02-21 DIAGNOSIS — Z3689 Encounter for other specified antenatal screening: Secondary | ICD-10-CM | POA: Diagnosis not present

## 2020-03-03 DIAGNOSIS — Z3201 Encounter for pregnancy test, result positive: Secondary | ICD-10-CM | POA: Diagnosis not present

## 2020-03-15 ENCOUNTER — Inpatient Hospital Stay (HOSPITAL_COMMUNITY)
Admission: AD | Admit: 2020-03-15 | Discharge: 2020-03-15 | Disposition: A | Discharge status: Home / Self Care | Payer: BC Managed Care – PPO | Attending: Obstetrics & Gynecology | Admitting: Obstetrics & Gynecology

## 2020-03-15 ENCOUNTER — Inpatient Hospital Stay (HOSPITAL_COMMUNITY): Payer: BC Managed Care – PPO

## 2020-03-15 ENCOUNTER — Other Ambulatory Visit: Payer: Self-pay

## 2020-03-15 ENCOUNTER — Encounter (HOSPITAL_COMMUNITY): Payer: Self-pay | Admitting: Obstetrics and Gynecology

## 2020-03-15 DIAGNOSIS — Z803 Family history of malignant neoplasm of breast: Secondary | ICD-10-CM | POA: Insufficient documentation

## 2020-03-15 DIAGNOSIS — K219 Gastro-esophageal reflux disease without esophagitis: Secondary | ICD-10-CM | POA: Diagnosis not present

## 2020-03-15 DIAGNOSIS — J302 Other seasonal allergic rhinitis: Secondary | ICD-10-CM

## 2020-03-15 DIAGNOSIS — M549 Dorsalgia, unspecified: Secondary | ICD-10-CM

## 2020-03-15 DIAGNOSIS — O99891 Other specified diseases and conditions complicating pregnancy: Secondary | ICD-10-CM | POA: Diagnosis not present

## 2020-03-15 DIAGNOSIS — O208 Other hemorrhage in early pregnancy: Secondary | ICD-10-CM | POA: Diagnosis not present

## 2020-03-15 DIAGNOSIS — M545 Low back pain: Secondary | ICD-10-CM | POA: Diagnosis not present

## 2020-03-15 DIAGNOSIS — Z825 Family history of asthma and other chronic lower respiratory diseases: Secondary | ICD-10-CM | POA: Diagnosis not present

## 2020-03-15 DIAGNOSIS — Z8249 Family history of ischemic heart disease and other diseases of the circulatory system: Secondary | ICD-10-CM | POA: Diagnosis not present

## 2020-03-15 DIAGNOSIS — Z3A08 8 weeks gestation of pregnancy: Secondary | ICD-10-CM

## 2020-03-15 DIAGNOSIS — R109 Unspecified abdominal pain: Secondary | ICD-10-CM | POA: Diagnosis not present

## 2020-03-15 DIAGNOSIS — O99611 Diseases of the digestive system complicating pregnancy, first trimester: Secondary | ICD-10-CM | POA: Diagnosis not present

## 2020-03-15 DIAGNOSIS — O26891 Other specified pregnancy related conditions, first trimester: Secondary | ICD-10-CM

## 2020-03-15 DIAGNOSIS — O99511 Diseases of the respiratory system complicating pregnancy, first trimester: Secondary | ICD-10-CM | POA: Diagnosis not present

## 2020-03-15 HISTORY — DX: Herpesviral infection, unspecified: B00.9

## 2020-03-15 HISTORY — DX: Respiratory tuberculosis unspecified: A15.9

## 2020-03-15 LAB — URINALYSIS, ROUTINE W REFLEX MICROSCOPIC
Bilirubin Urine: NEGATIVE
Glucose, UA: NEGATIVE mg/dL
Hgb urine dipstick: NEGATIVE
Ketones, ur: NEGATIVE mg/dL
Leukocytes,Ua: NEGATIVE
Nitrite: NEGATIVE
Protein, ur: NEGATIVE mg/dL
Specific Gravity, Urine: 1.011 (ref 1.005–1.030)
pH: 7 (ref 5.0–8.0)

## 2020-03-15 LAB — POCT PREGNANCY, URINE: Preg Test, Ur: POSITIVE — AB

## 2020-03-15 MED ORDER — LORATADINE 10 MG PO TABS
10.0000 mg | ORAL_TABLET | Freq: Every day | ORAL | 0 refills | Status: DC
Start: 2020-03-15 — End: 2020-08-27

## 2020-03-15 MED ORDER — FAMOTIDINE 20 MG PO TABS
20.0000 mg | ORAL_TABLET | Freq: Every day | ORAL | 0 refills | Status: DC
Start: 2020-03-15 — End: 2020-08-27

## 2020-03-15 NOTE — Discharge Instructions (Signed)
Heartburn During Pregnancy  Heartburn is pain or discomfort in the throat or chest. It may cause a burning feeling. It happens when stomach acid moves up into the tube that carries food from your mouth to your stomach (esophagus). Heartburn is common during pregnancy. It usually goes away or gets better after giving birth. Follow these instructions at home: Eating and drinking  Do not drink alcohol while you are pregnant.  Figure out which foods and beverages make you feel worse, and avoid them.  Beverages that you may want to avoid include: ? Coffee and tea (with or without caffeine). ? Energy drinks and sports drinks. ? Bubbly (carbonated) drinks or sodas. ? Citrus fruit juices.  Foods that you may want to avoid include: ? Chocolate and cocoa. ? Peppermint and mint flavorings. ? Garlic, onions, and horseradish. ? Spicy and acidic foods. These include peppers, chili powder, curry powder, vinegar, hot sauces, and barbecue sauce. ? Citrus fruits, such as oranges, lemons, and limes. ? Tomato-based foods, such as red sauce, chili, and salsa. ? Fried and fatty foods, such as donuts, french fries, potato chips, and high-fat dressings. ? High-fat meats, such as hot dogs, cold cuts, sausage, ham, and bacon. ? High-fat dairy items, such as whole milk, butter, and cheese.  Eat small meals often, instead of large meals.  Avoid drinking a lot of liquid with your meals.  Avoid eating meals during the 2-3 hours before you go to bed.  Avoid lying down right after you eat.  Do not exercise right after you eat. Medicines  Take over-the-counter and prescription medicines only as told by your doctor.  Do not take aspirin, ibuprofen, or other NSAIDs unless your doctor tells you to do that.  Your doctor may tell you to avoid medicines that have sodium bicarbonate in them. General instructions   If told, raise the head of your bed about 6 inches (15 cm). You can do this by putting blocks  under the legs. Sleeping with more pillows does not help with heartburn.  Do not use any products that contain nicotine or tobacco, such as cigarettes and e-cigarettes. If you need help quitting, ask your doctor.  Wear loose-fitting clothing.  Try to lower your stress, such as with yoga or meditation. If you need help, ask your doctor.  Stay at a healthy weight. If you are overweight, work with your doctor to safely lose weight.  Keep all follow-up visits as told by your doctor. This is important. Contact a doctor if:  You get new symptoms.  Your symptoms do not get better with treatment.  You have weight loss and you do not know why.  You have trouble swallowing.  You make loud sounds when you breathe (wheeze).  You have a cough that does not go away.  You have heartburn often for more than 2 weeks.  You feel sick to your stomach (nauseous), and this does not get better with treatment.  You are throwing up (vomiting), and this does not get better with treatment.  You have pain in your belly (abdomen). Get help right away if:  You have very bad chest pain that spreads to your arm, neck, or jaw.  You feel sweaty, dizzy, or light-headed.  You have trouble breathing.  You have pain when swallowing.  You throw up and your throw-up looks like blood or coffee grounds.  Your poop (stool) is bloody or black. This information is not intended to replace advice given to you by your health   care provider. Make sure you discuss any questions you have with your health care provider. Document Revised: 01/25/2019 Document Reviewed: 06/21/2016 Elsevier Patient Education  2020 [employer].   Allergic Rhinitis, Adult Allergic rhinitis is a reaction to allergens in the air. Allergens are tiny specks (particles) in the air that cause your body to have an allergic reaction. This condition cannot be passed from person to person (is not contagious). Allergic rhinitis cannot be cured, but  it can be controlled. There are two types of allergic rhinitis:  Seasonal. This type is also called hay fever. It happens only during certain times of the year.  Perennial. This type can happen at any time of the year. What are the causes? This condition may be caused by:  Pollen from grasses, trees, and weeds.  House dust mites.  Pet dander.  Mold. What are the signs or symptoms? Symptoms of this condition include:  Sneezing.  Runny or stuffy nose (nasal congestion).  A lot of mucus in the back of the throat (postnasal drip).  Itchy nose.  Tearing of the eyes.  Trouble sleeping.  Being sleepy during day. How is this treated? There is no cure for this condition. You should avoid things that trigger your symptoms (allergens). Treatment can help to relieve symptoms. This may include:  Medicines that block allergy symptoms, such as antihistamines. These may be given as a shot, nasal spray, or pill.  Shots that are given until your body becomes less sensitive to the allergen (desensitization).  Stronger medicines, if all other treatments have not worked. Follow these instructions at home: Avoiding allergens   Find out what you are allergic to. Common allergens include smoke, dust, and pollen.  Avoid them if you can. These are some of the things that you can do to avoid allergens: ? Replace carpet with wood, tile, or vinyl flooring. Carpet can trap dander and dust. ? Clean any mold found in the home. ? Do not smoke. Do not allow smoking in your home. ? Change your heating and air conditioning filter at least once a month. ? During allergy season:  Keep windows closed as much as you can. If possible, use air conditioning when there is a lot of pollen in the air.  Use a special filter for allergies with your furnace and air conditioner.  Plan outdoor activities when pollen counts are lowest. This is usually during the early morning or evening hours.  If you do go  outdoors when pollen count is high, wear a special mask for people with allergies.  When you come indoors, take a shower and change your clothes before sitting on furniture or bedding. General instructions  Do not use fans in your home.  Do not hang clothes outside to dry.  Wear sunglasses to keep pollen out of your eyes.  Wash your hands right away after you touch household pets.  Take over-the-counter and prescription medicines only as told by your doctor.  Keep all follow-up visits as told by your doctor. This is important. Contact a doctor if:  You have a fever.  You have a cough that does not go away (is persistent).  You start to make whistling sounds when you breathe (wheeze).  Your symptoms do not get better with treatment.  You have thick fluid coming from your nose.  You start to have nosebleeds. Get help right away if:  Your tongue or your lips are swollen.  You have trouble breathing.  You feel dizzy or you  feel like you are going to pass out (faint).  You have cold sweats. Summary  Allergic rhinitis is a reaction to allergens in the air.  This condition may be caused by allergens. These include pollen, dust mites, pet dander, and mold.  Symptoms include a runny, itchy nose, sneezing, or tearing eyes. You may also have trouble sleeping or feel sleepy during the day.  Treatment includes taking medicines and avoiding allergens. You may also get shots or take stronger medicines.  Get help if you have a fever or a cough that does not stop. Get help right away if you are short of breath. This information is not intended to replace advice given to you by your health care provider. Make sure you discuss any questions you have with your health care provider. Document Revised: 01/23/2019 Document Reviewed: 04/25/2018 Elsevier Patient Education  [place].

## 2020-03-15 NOTE — MAU Note (Signed)
Debbie Greene is a 34 y.o. at [redacted]w[redacted]d here in MAU reporting:  +HPT LMP: 01/15/2020  +cough Onset of complaint: on and off every year. Worse this year.  Endorses that she was + for TB "years ago" and expresses concern for if the cough is from that or allergies.  +back/side pain +upper mid abominal pain Constant cramping Pain score: 4/10 Onset of complaint:beginning of May Endorses moving around helps "I feel like I have to throw up but I can't. I don't know how to describe it except that it's like there is iron sitting in the middle that needs to come up but wont. A uncomfortable feeling in my stomach everyday all day."  Vitals:   03/15/20 0924  BP: 104/64  Pulse: 77  Resp: 16  Temp: 98.3 F (36.8 C)  SpO2: 100%     Lab orders placed from triage: UA and POC pregnancy test  Switching from Dr. Cherly Hensen to [facility]. Reports that she is awaiting a call back from [facility] for an appointment.

## 2020-03-15 NOTE — MAU Provider Note (Addendum)
History     CSN: [id number]  Arrival date and time: 03/15/20 1700   First Provider Initiated Contact with Patient 03/15/20 [phone]      Chief Complaint  Patient presents with  . Abdominal Pain  . Back Pain  . Cough   34 y.o. [id number] @[redacted]w[redacted]d  presenting with cough, HA, LBP, and flank pain. Reports onset of cough in the last year. Cough is nonproductive, AC makes it worse. Had hx of +ppd but negative CXR in past. Was taking allergy medicine but stopped it in March. HA has been ongoing. Located frontal. No associated nausea or visual disturbance. Thinks HA is caused by pressure from coughing. LBP and bilateral flank pain started 2 weeks ago. Denies lifting, pushing, pulling, or injury. Denies urinary sx. When asked about abdominal pain, states "the pain is all over my body". Denies VB or discharge. Also reports feeling like something is stuck in throat and she needs to throw up but denies N/V. Has indigestion at times.   OB History    Gravida  6   Para  1   Term  1   Preterm      AB  4   Living  1     SAB      TAB  4   Ectopic      Multiple      Live Births  1           Past Medical History:  Diagnosis Date  . Herpes   . High risk sexual behavior 10/01/2016  . Tuberculosis     Past Surgical History:  Procedure Laterality Date  . THERAPEUTIC ABORTION     Pt states that she has had 2 abortions.     Family History  Problem Relation Age of Onset  . Cancer Maternal Grandmother        Breast  . Asthma Mother   . Hypertension Mother     Social History   Tobacco Use  . Smoking status: Never Smoker  . Smokeless tobacco: Never Used  Substance Use Topics  . Alcohol use: Yes    Comment: Occassionally.   . Drug use: No    Allergies: No Known Allergies  No medications prior to admission.    Review of Systems  Constitutional: Negative for chills, fever and unexpected weight change.  HENT: Positive for congestion and sneezing. Negative for sore throat.    Eyes: Negative for photophobia.  Respiratory: Positive for cough. Negative for shortness of breath.   Cardiovascular: Negative for chest pain.  Gastrointestinal: Negative for abdominal pain, nausea and vomiting.  Genitourinary: Positive for flank pain. Negative for dysuria, vaginal bleeding and vaginal discharge.  Musculoskeletal: Positive for back pain.  Neurological: Positive for headaches.   Physical Exam   Blood pressure 125/75, pulse 73, temperature 98.3 F (36.8 C), temperature source Oral, resp. rate 16, weight 79.8 kg, last menstrual period 01/15/2020, SpO2 100 %.  Physical Exam  Nursing note and vitals reviewed. Constitutional: She is oriented to person, place, and time. She appears well-developed and well-nourished. No distress.  HENT:  Head: Normocephalic and atraumatic.  Cardiovascular: Normal rate, regular rhythm and normal heart sounds.  Respiratory: Effort normal and breath sounds normal. No respiratory distress. She has no wheezes. She has no rales.  GI: Soft. She exhibits no distension and no mass. There is no abdominal tenderness. There is no rebound, no guarding and no CVA tenderness.  Genitourinary:    Genitourinary Comments: deferred   Musculoskeletal:  General: Normal range of motion.     Cervical back: Normal range of motion. Normal.     Thoracic back: Normal.     Lumbar back: Normal.  Neurological: She is alert and oriented to person, place, and time.  Skin: Skin is warm and dry.  Psychiatric: She has a normal mood and affect.   Results for orders placed or performed during the hospital encounter of 03/15/20 (from the past 24 hour(s))  Urinalysis, Routine w reflex microscopic     Status: None   Collection Time: 03/15/20  9:03 AM  Result Value Ref Range   Color, Urine YELLOW YELLOW   APPearance CLEAR CLEAR   Specific Gravity, Urine 1.011 1.005 - 1.030   pH 7.0 5.0 - 8.0   Glucose, UA NEGATIVE NEGATIVE mg/dL   Hgb urine dipstick NEGATIVE NEGATIVE    Bilirubin Urine NEGATIVE NEGATIVE   Ketones, ur NEGATIVE NEGATIVE mg/dL   Protein, ur NEGATIVE NEGATIVE mg/dL   Nitrite NEGATIVE NEGATIVE   Leukocytes,Ua NEGATIVE NEGATIVE  Pregnancy, urine POC     Status: Abnormal   Collection Time: 03/15/20  9:05 AM  Result Value Ref Range   Preg Test, Ur POSITIVE (A) NEGATIVE   US OB Comp Less 14 Wks  Result Date: 03/15/2020 CLINICAL DATA:  Back and abdominal pain in 1st trimester pregnancy. EXAM: OBSTETRIC <14 WK ULTRASOUND TECHNIQUE: Transabdominal ultrasound was performed for evaluation of the gestation as well as the maternal uterus and adnexal regions. COMPARISON:  None. FINDINGS: Intrauterine gestational sac: Single Yolk sac:  Visualized. Embryo:  Visualized. Cardiac Activity: Visualized. Heart Rate: 161 bpm CRL:   20 mm   8 w 4 d                  [place] EDC: 10/21/2020 Subchorionic hemorrhage:  Small subchorionic hemorrhage noted. Maternal uterus/adnexae: Both ovaries are normal in appearance. No masses or abnormal free fluid identified. IMPRESSION: Single living IUP with estimated gestational age of [redacted] weeks 4 days, and [place] EDC of 10/21/2020. Small subchorionic hemorrhage noted. Electronically Signed   By: Danae Orleans M.D.   On: 03/15/2020 10:50   MAU Course  Procedures  MDM Labs and [place] ordered and reviewed. Suspect GERD, recommend Pepcid. Suspect seasonal allergies causing cough and HA, recommend Claritin. No evidence of pyelo or acute process. Discussed treatments for MSK pain. Viable IUP confirmed by [place]. Stable for discharge home. Pt plans to transfer care to [facility].  Assessment and Plan   1. [redacted] weeks gestation of pregnancy   2. Back pain in pregnancy   3. Musculoskeletal back pain   4. Gastroesophageal reflux disease without esophagitis   5. Seasonal allergic rhinitis, unspecified trigger    Discharge home Follow up at [facility] to start care Seek care from PCP for persistent respiratory sx Return to MAU for OB emergencies Rx Claritin Rx  Pepcid  Allergies as of 03/15/2020   No Known Allergies     Medication List    STOP taking these medications   naproxen 500 MG tablet Commonly known as: NAPROSYN   omeprazole 20 MG capsule Commonly known as: PRILOSEC   tobramycin 0.3 % ophthalmic solution Commonly known as: Tobrex     TAKE these medications   famotidine 20 MG tablet Commonly known as: PEPCID Take 1 tablet (20 mg total) by mouth at bedtime.   loratadine 10 MG tablet Commonly known as: CLARITIN Take 1 tablet (10 mg total) by mouth daily.   valACYclovir 1000 MG tablet Commonly known as: VALTREX  Take 1 tablet (1,000 mg total) by mouth daily.      Donette Larry, CNM 03/15/2020, 11:40 AM

## 2020-03-25 ENCOUNTER — Other Ambulatory Visit: Payer: Self-pay

## 2020-03-25 ENCOUNTER — Inpatient Hospital Stay (HOSPITAL_COMMUNITY)
Admission: AD | Admit: 2020-03-25 | Discharge: 2020-03-25 | Disposition: A | Discharge status: Home / Self Care | Payer: BC Managed Care – PPO | Attending: Obstetrics and Gynecology | Admitting: Obstetrics and Gynecology

## 2020-03-25 ENCOUNTER — Encounter (HOSPITAL_COMMUNITY): Payer: Self-pay | Admitting: Obstetrics and Gynecology

## 2020-03-25 ENCOUNTER — Inpatient Hospital Stay (HOSPITAL_COMMUNITY): Payer: BC Managed Care – PPO

## 2020-03-25 DIAGNOSIS — R1032 Left lower quadrant pain: Secondary | ICD-10-CM | POA: Insufficient documentation

## 2020-03-25 DIAGNOSIS — O26891 Other specified pregnancy related conditions, first trimester: Secondary | ICD-10-CM | POA: Insufficient documentation

## 2020-03-25 DIAGNOSIS — Z79899 Other long term (current) drug therapy: Secondary | ICD-10-CM | POA: Insufficient documentation

## 2020-03-25 DIAGNOSIS — M79605 Pain in left leg: Secondary | ICD-10-CM | POA: Diagnosis not present

## 2020-03-25 DIAGNOSIS — M79662 Pain in left lower leg: Secondary | ICD-10-CM | POA: Diagnosis not present

## 2020-03-25 DIAGNOSIS — Z3A10 10 weeks gestation of pregnancy: Secondary | ICD-10-CM

## 2020-03-25 DIAGNOSIS — O26899 Other specified pregnancy related conditions, unspecified trimester: Secondary | ICD-10-CM

## 2020-03-25 DIAGNOSIS — R109 Unspecified abdominal pain: Secondary | ICD-10-CM | POA: Diagnosis not present

## 2020-03-25 LAB — HCG, QUANTITATIVE, PREGNANCY: hCG, Beta Chain, Quant, S: 120177 m[IU]/mL — ABNORMAL HIGH (ref <5)

## 2020-03-25 LAB — CBC
HCT: 31.7 % — ABNORMAL LOW (ref 36.0–46.0)
Hemoglobin: 10.2 g/dL — ABNORMAL LOW (ref 12.0–15.0)
MCH: 27.5 pg (ref 26.0–34.0)
MCHC: 32.2 g/dL (ref 30.0–36.0)
MCV: 85.4 fL (ref 80.0–100.0)
Platelets: 253 10*3/uL (ref 150–400)
RBC: 3.71 MIL/uL — ABNORMAL LOW (ref 3.87–5.11)
RDW: 14.2 % (ref 11.5–15.5)
WBC: 7.5 10*3/uL (ref 4.0–10.5)
nRBC: 0 % (ref 0.0–0.2)

## 2020-03-25 LAB — WET PREP, GENITAL
Clue Cells Wet Prep HPF POC: NONE SEEN
Sperm: NONE SEEN
Trich, Wet Prep: NONE SEEN
Yeast Wet Prep HPF POC: NONE SEEN

## 2020-03-25 LAB — HIV ANTIBODY (ROUTINE TESTING W REFLEX): HIV Screen 4th Generation wRfx: NONREACTIVE

## 2020-03-25 NOTE — MAU Provider Note (Signed)
History     CSN: [id number]  Arrival date and time: 03/25/20 1050   First Provider Initiated Contact with Patient 03/25/20 1227      Chief Complaint  Patient presents with  . Leg Pain   [name] is a 34 y.o. year old [id number] female at [redacted]w[redacted]d weeks gestation who presents to MAU reporting she woke up with "severe" LT groin pain that radiates all the way down through her hip, LT leg down to her ankle. She describes the pain as sharp & constant; rated 6/10. She also complains of a sinus H/A that has not responded to Children's Tylenol. She took Children's Tylenol, because she wasn't certain of what she could take. She denies any vaginal bleeding or abnormal vaginal discharge. She receives [facility].   OB History    Gravida  6   Para  1   Term  1   Preterm      AB  4   Living  1     SAB      TAB  4   Ectopic      Multiple      Live Births  1           Past Medical History:  Diagnosis Date  . Herpes   . High risk sexual behavior 10/01/2016  . Tuberculosis     Past Surgical History:  Procedure Laterality Date  . THERAPEUTIC ABORTION     Pt states that she has had 2 abortions.     Family History  Problem Relation Age of Onset  . Cancer Maternal Grandmother        Breast  . Asthma Mother   . Hypertension Mother     Social History   Tobacco Use  . Smoking status: Never Smoker  . Smokeless tobacco: Never Used  Substance Use Topics  . Alcohol use: Yes    Comment: Occassionally.   . Drug use: No    Allergies: No Known Allergies  Medications Prior to Admission  Medication Sig Dispense Refill Last Dose  . acetaminophen (TYLENOL) 160 MG chewable tablet Chew 160 mg by mouth every 6 (six) hours as needed for pain.   03/24/2020 at Unknown time  . famotidine (PEPCID) 20 MG tablet Take 1 tablet (20 mg total) by mouth at bedtime. 30 tablet 0 Past Week at Unknown time  . valACYclovir (VALTREX) 1000 MG tablet Take 1 tablet (1,000 mg total) by  mouth daily. 30 tablet 10 Past Week at Unknown time  . loratadine (CLARITIN) 10 MG tablet Take 1 tablet (10 mg total) by mouth daily. 30 tablet 0 More than a month at Unknown time    Review of Systems  Constitutional: Negative.   HENT: Negative.   Eyes: Negative.   Respiratory: Negative.   Cardiovascular: Negative.   Gastrointestinal: Negative.   Endocrine: Negative.   Genitourinary: Negative.   Musculoskeletal: Positive for myalgias.  Skin: Negative.   Allergic/Immunologic: Negative.   Neurological: Positive for headaches ("sinus H/A").  Hematological: Negative.   Psychiatric/Behavioral: Negative.    Physical Exam   Blood pressure (!) 102/59, pulse 67, temperature 99.2 F (37.3 C), temperature source Oral, resp. rate 16, height 5' 7.75" (1.721 m), weight 79.6 kg, last menstrual period 01/15/2020, SpO2 100 %.  Physical Exam  Nursing note and vitals reviewed. Constitutional: She is oriented to person, place, and time.  HENT:  Head: Normocephalic and atraumatic.  Nose: Nose normal.  Eyes: Pupils are equal, round, and reactive to light.  Cardiovascular: Normal rate, regular rhythm and normal pulses.  Respiratory: Effort normal.  GI: Soft. Normal appearance.  Genitourinary:    Vulva normal.     Genitourinary Comments: Uterus: non-tender, SE: cervix is smooth, pink, no lesions, small amt of thick, white vaginal d/c -- WP, GC/CT done, closed/long/firm, no CMT or friability, no adnexal tenderness    Musculoskeletal:        General: Normal range of motion.     Cervical back: Normal range of motion.  Neurological: She is alert and oriented to person, place, and time.  Skin: Skin is warm and dry.  Psychiatric: Her behavior is normal. Mood, judgment and thought content normal.    MAU Course  Procedures  MDM CCUA Wet Prep GC/CT CBC HCG OB U/S <14 wks with TV  Results for orders placed or performed during the hospital encounter of 03/25/20 (from the past 48 hour(s))   GC/Chlamydia probe amp ([place])not at [facility]     Status: None   Collection Time: 03/25/20 11:53 AM  Result Value Ref Range   Neisseria Gonorrhea Negative    Chlamydia Negative    Comment Normal Reference Ranger Chlamydia - Negative    Comment      Normal Reference Range Neisseria Gonorrhea - Negative  HIV Antibody (routine testing w rflx)     Status: None   Collection Time: 03/25/20 12:15 PM  Result Value Ref Range   HIV Screen 4th Generation wRfx Non Reactive Non Reactive    Comment: Performed at [facility], [address] [place], [place] [address]  CBC     Status: Abnormal   Collection Time: 03/25/20 12:15 PM  Result Value Ref Range   WBC 7.5 4.0 - 10.5 K/uL   RBC 3.71 (L) 3.87 - 5.11 MIL/uL   Hemoglobin 10.2 (L) 12.0 - 15.0 g/dL   HCT 35.3 (L) 36 - 46 %   MCV 85.4 80.0 - 100.0 fL   MCH 27.5 26.0 - 34.0 pg   MCHC 32.2 30.0 - 36.0 g/dL   RDW 61.4 43.1 - 54.0 %   Platelets 253 150 - 400 K/uL   nRBC 0.0 0.0 - 0.2 %    Comment: Performed at [facility], [address], [place], [place] [address]  hCG, quantitative, pregnancy     Status: Abnormal   Collection Time: 03/25/20 12:15 PM  Result Value Ref Range   hCG, Beta Chain, Quant, S 120,177 (H) <5 mIU/mL    Comment:          GEST. AGE      CONC.  (mIU/mL)   <=1 WEEK        5 - 50     2 WEEKS       50 - 500     3 WEEKS       100 - 10,000     4 WEEKS     1,000 - 30,000     5 WEEKS     3,500 - 115,000   6-8 WEEKS     12,000 - 270,000    12 WEEKS     15,000 - 220,000        FEMALE AND NON-PREGNANT FEMALE:     LESS THAN 5 mIU/mL Performed at [facility], [address] [place], [place] [address]   Wet prep, genital     Status: Abnormal   Collection Time: 03/25/20 12:43 PM   Specimen: Cervix  Result Value Ref Range  Yeast Wet Prep HPF POC NONE SEEN NONE SEEN   Trich, Wet Prep NONE SEEN NONE SEEN   Clue Cells Wet Prep HPF POC NONE SEEN NONE SEEN   WBC, Wet Prep HPF POC FEW (A) NONE SEEN   Sperm  NONE SEEN     Comment: Performed at [facility], [address], [place], [place] [address]    US OB LESS THAN 14 WEEKS WITH OB TRANSVAGINAL  Result Date: 03/25/2020 CLINICAL DATA:  Abdominal pain in first trimester of pregnancy, LMP 01/15/2020, quantitative beta HCG 120,177 EXAM: OBSTETRIC <14 WK [place] AND TRANSVAGINAL OB US TECHNIQUE: Both transabdominal and transvaginal ultrasound examinations were performed for complete evaluation of the gestation as well as the maternal uterus, adnexal regions, and pelvic cul-de-sac. Transvaginal technique was performed to assess early pregnancy. COMPARISON:  03/15/2020 FINDINGS: Intrauterine gestational sac: Present, single Yolk sac:  Present Embryo:  Present Cardiac Activity: Present Heart Rate: 169 bpm CRL:  34.8 mm   10 w   2 d                  [place] EDC: 10/19/2020 Subchorionic hemorrhage:  None visualized. Maternal uterus/adnexae: Uterus retroverted with heterogeneous thickening of the posterior wall, cannot exclude subtle fibroid or focal adenomyosis. RIGHT ovary normal size and morphology 5.3 x 4.2 x 3.5 cm, containing a dominant follicle. LEFT ovary normal size and morphology, 4.9 x 3.1 x 3.1 cm. No free pelvic fluid or adnexal masses. IMPRESSION: Single live intrauterine gestation at 10 weeks 2 days EGA by crown-rump length. Heterogeneous thickening of the posterior wall of the uterus, cannot exclude subtle fibroid or focal adenomyosis. Electronically Signed   By: Ulyses Southward M.D.   On: 03/25/2020 13:56    Assessment and Plan  Left leg pain  - Advised to take Tylenol 1000 mg po every 6 hrs prn pain - Can soak in a tub of warm water  Abdominal pain during pregnancy - Advised of resolved [facility] - Patient unaware of [facility] from last U/S, information on [facility] given   - Discharge patient - Keep scheduled appt with [facility] - Patient verbalized an understanding of the plan of care and agrees.     Raelyn Mora, MSN, CNM 03/25/2020, 12:37 PM

## 2020-03-25 NOTE — Discharge Instructions (Signed)

## 2020-03-25 NOTE — MAU Note (Signed)
Pt states she woke up with pain on her left side from  hip to ankle at approx 2-3 a.m. She states the pain is sharp and constant. Rating pain 6/10.  She has also had a sinus headache for a while that does not respond to meds for longer than 30 mins. Denies vaginal bleeding or discharge.

## 2020-03-26 LAB — GC/CHLAMYDIA PROBE AMP (~~LOC~~) NOT AT ARMC
Chlamydia: NEGATIVE
Comment: NEGATIVE
Comment: NORMAL
Neisseria Gonorrhea: NEGATIVE

## 2020-04-08 ENCOUNTER — Ambulatory Visit: Payer: BC Managed Care – PPO | Admitting: *Deleted

## 2020-04-08 DIAGNOSIS — Z3481 Encounter for supervision of other normal pregnancy, first trimester: Secondary | ICD-10-CM

## 2020-04-08 DIAGNOSIS — Z3480 Encounter for supervision of other normal pregnancy, unspecified trimester: Secondary | ICD-10-CM | POA: Insufficient documentation

## 2020-04-08 MED ORDER — BLOOD PRESSURE MONITOR KIT
1.0000 | PACK | Freq: Once | 0 refills | Status: AC
Start: 2020-04-08 — End: 2020-04-08

## 2020-04-08 NOTE — Progress Notes (Signed)
I connected with  Debbie Greene on 04/08/20 by a video enabled telemedicine application and verified that I am speaking with the correct person using two identifiers.   I discussed the limitations of evaluation and management by telemedicine. The patient expressed understanding and agreed to proceed.   PRENATAL INTAKE SUMMARY  [name] presents today New OB Nurse Interview.  OB History    Gravida  6   Para  1   Term  1   Preterm      AB  4   Living  1     SAB      TAB  4   Ectopic      Multiple      Live Births  1          I have reviewed the patient's medical, obstetrical, social, and family histories, medications, and available lab results.  SUBJECTIVE She has no unusual complaints and complains of headache, nausea, cough and left side pain.   OBJECTIVE Initial Physical Exam (New OB)  GENERAL APPEARANCE: sounds appropriate over televisit.   ASSESSMENT Normal pregnancy  PLAN Prenatal care at [facility] Recommend Unisom and VitB6 for Nausea, allergy meds and Robitussin for cough and Tylenol for HA's.  BP cuff ordered BabyRx ordered

## 2020-04-09 NOTE — Progress Notes (Signed)
Patient was assessed and managed by nursing staff during this encounter. I have reviewed the chart and agree with the documentation and plan. I have also made any necessary editorial changes.  Warden Fillers, MD 04/09/2020 9:11 PM

## 2020-04-14 ENCOUNTER — Encounter: Payer: BC Managed Care – PPO | Admitting: Obstetrics

## 2020-04-18 ENCOUNTER — Other Ambulatory Visit (HOSPITAL_COMMUNITY)
Admission: RE | Admit: 2020-04-18 | Discharge: 2020-04-18 | Disposition: A | Discharge status: Home / Self Care | Payer: BC Managed Care – PPO | Source: Ambulatory Visit | Attending: Obstetrics | Admitting: Obstetrics

## 2020-04-18 ENCOUNTER — Ambulatory Visit (INDEPENDENT_AMBULATORY_CARE_PROVIDER_SITE_OTHER): Payer: BC Managed Care – PPO | Admitting: Obstetrics

## 2020-04-18 ENCOUNTER — Encounter: Payer: Self-pay | Admitting: Obstetrics

## 2020-04-18 ENCOUNTER — Other Ambulatory Visit: Payer: Self-pay

## 2020-04-18 VITALS — BP 110/74 | HR 84 | Wt 175.0 lb

## 2020-04-18 DIAGNOSIS — Z3481 Encounter for supervision of other normal pregnancy, first trimester: Secondary | ICD-10-CM

## 2020-04-18 DIAGNOSIS — Z3480 Encounter for supervision of other normal pregnancy, unspecified trimester: Secondary | ICD-10-CM | POA: Diagnosis not present

## 2020-04-18 DIAGNOSIS — Z3A13 13 weeks gestation of pregnancy: Secondary | ICD-10-CM | POA: Diagnosis not present

## 2020-04-18 DIAGNOSIS — Z31430 Encounter of female for testing for genetic disease carrier status for procreative management: Secondary | ICD-10-CM | POA: Diagnosis not present

## 2020-04-18 NOTE — Addendum Note (Signed)
Addended by: Coral Ceo A on: 04/18/2020 10:13 AM   Modules accepted: Orders

## 2020-04-18 NOTE — Progress Notes (Addendum)
Subjective:    Debbie Greene is being seen today for her first obstetrical visit.  This is not a planned pregnancy. She is at [redacted]w[redacted]d gestation. Her obstetrical history is significant for none. Relationship with FOB: significant other, living together. Patient does intend to breast feed. Pregnancy history fully reviewed.  The information documented in the HPI was reviewed and verified.  Menstrual History: OB History    Gravida  6   Para  1   Term  1   Preterm      AB  4   Living  1     SAB      TAB  4   Ectopic      Multiple      Live Births  1            Patient's last menstrual period was 01/15/2020.    Past Medical History:  Diagnosis Date  . Herpes   . High risk sexual behavior 10/01/2016  . Tuberculosis     Past Surgical History:  Procedure Laterality Date  . THERAPEUTIC ABORTION     Pt states that she has had 2 abortions.     (Not in a hospital admission)  No Known Allergies  Social History   Tobacco Use  . Smoking status: Never Smoker  . Smokeless tobacco: Never Used  Substance Use Topics  . Alcohol use: Not Currently    Comment: Occassionally.     Family History  Problem Relation Age of Onset  . Cancer Maternal Grandmother        Breast  . Asthma Mother   . Hypertension Mother      Review of Systems Constitutional: negative for weight loss Gastrointestinal: negative for vomiting Genitourinary:negative for genital lesions and vaginal discharge and dysuria Musculoskeletal:negative for back pain Behavioral/Psych: negative for abusive relationship, depression, illegal drug usage and tobacco use    Objective:    BP 110/74   Pulse 84   Wt 175 lb (79.4 kg)   LMP 01/15/2020   BMI 26.81 kg/m  General Appearance:    Alert, cooperative, no distress, appears stated age  Head:    Normocephalic, without obvious abnormality, atraumatic  Eyes:    PERRL, conjunctiva/corneas clear, EOM's intact, fundi    benign, both eyes  Ears:    Normal  TM's and external ear canals, both ears  Nose:   Nares normal, septum midline, mucosa normal, no drainage    or sinus tenderness  Throat:   Lips, mucosa, and tongue normal; teeth and gums normal  Neck:   Supple, symmetrical, trachea midline, no adenopathy;    thyroid:  no enlargement/tenderness/nodules; no carotid   bruit or JVD  Back:     Symmetric, no curvature, ROM normal, no CVA tenderness  Lungs:     Clear to auscultation bilaterally, respirations unlabored  Chest Wall:    No tenderness or deformity   Heart:    Regular rate and rhythm, S1 and S2 normal, no murmur, rub   or gallop  Breast Exam:    No tenderness, masses, or nipple abnormality  Abdomen:     Soft, non-tender, bowel sounds active all four quadrants,    no masses, no organomegaly  Genitalia:    Normal female without lesion, discharge or tenderness  Extremities:   Extremities normal, atraumatic, no cyanosis or edema  Pulses:   2+ and symmetric all extremities  Skin:   Skin color, texture, turgor normal, no rashes or lesions  Lymph nodes:   Cervical, supraclavicular,  and axillary nodes normal  Neurologic:   CNII-XII intact, normal strength, sensation and reflexes    throughout      Lab Review Urine pregnancy test Labs reviewed yes Radiologic studies reviewed yes  Assessment:    Pregnancy at [redacted]w[redacted]d weeks    Plan:     1. Supervision of other normal pregnancy, antepartum Rx: - Genetic Screening - Cervicovaginal ancillary only - Culture, OB Urine - Obstetric Panel, Including HIV  Prenatal vitamins.  Counseling provided regarding continued use of seat belts, cessation of alcohol consumption, smoking or use of illicit drugs; infection precautions i.e., influenza/TDAP immunizations, toxoplasmosis,CMV, parvovirus, listeria and varicella; workplace safety, exercise during pregnancy; routine dental care, safe medications, sexual activity, hot tubs, saunas, pools, travel, caffeine use, fish and methlymercury, potential  toxins, hair treatments, varicose veins Weight gain recommendations per IOM guidelines reviewed: underweight/BMI< 18.5--> gain 28 - 40 lbs; normal weight/BMI 18.5 - 24.9--> gain 25 - 35 lbs; overweight/BMI 25 - 29.9--> gain 15 - 25 lbs; obese/BMI >30->gain  11 - 20 lbs Problem list reviewed and updated. FIRST/CF mutation testing/NIPT/QUAD SCREEN/fragile X/Ashkenazi Jewish population testing/Spinal muscular atrophy discussed: requested. Role of ultrasound in pregnancy discussed; fetal survey: requested. Amniocentesis discussed: not indicated.  No orders of the defined types were placed in this encounter.  Orders Placed This Encounter  Procedures  . Culture, OB Urine  . [place] MFM OB COMP + 14 WK    Standing Status:   Future    Standing Expiration Date:   04/18/2021    Order Specific Question:   Reason for Exam (SYMPTOM  OR DIAGNOSIS REQUIRED)    Answer:   Anatomy    Order Specific Question:   Preferred Location    Answer:   [facility] Ultrasound  . Genetic Screening  . Obstetric Panel, Including HIV    Follow up in 4 weeks. 50% of 25 min visit spent on counseling and coordination of care.    Brock Bad, MD 04/18/2020 10:13 AM

## 2020-04-19 LAB — OBSTETRIC PANEL, INCLUDING HIV
Antibody Screen: NEGATIVE
Basophils Absolute: 0 10*3/uL (ref 0.0–0.2)
Basos: 0 %
EOS (ABSOLUTE): 0.1 10*3/uL (ref 0.0–0.4)
Eos: 1 %
HIV Screen 4th Generation wRfx: NONREACTIVE
Hematocrit: 32.2 % — ABNORMAL LOW (ref 34.0–46.6)
Hemoglobin: 10.5 g/dL — ABNORMAL LOW (ref 11.1–15.9)
Hepatitis B Surface Ag: NEGATIVE
Immature Grans (Abs): 0 10*3/uL (ref 0.0–0.1)
Immature Granulocytes: 0 %
Lymphocytes Absolute: 1.3 10*3/uL (ref 0.7–3.1)
Lymphs: 20 %
MCH: 27.8 pg (ref 26.6–33.0)
MCHC: 32.6 g/dL (ref 31.5–35.7)
MCV: 85 fL (ref 79–97)
Monocytes Absolute: 0.5 10*3/uL (ref 0.1–0.9)
Monocytes: 7 %
Neutrophils Absolute: 4.6 10*3/uL (ref 1.4–7.0)
Neutrophils: 72 %
Platelets: 251 10*3/uL (ref 150–450)
RBC: 3.78 x10E6/uL (ref 3.77–5.28)
RDW: 14.1 % (ref 11.7–15.4)
RPR Ser Ql: NONREACTIVE
Rh Factor: NEGATIVE
Rubella Antibodies, IGG: <0.9 index — ABNORMAL LOW (ref >0.99)
WBC: 6.4 10*3/uL (ref 3.4–10.8)

## 2020-04-20 ENCOUNTER — Other Ambulatory Visit: Payer: Self-pay | Admitting: Obstetrics

## 2020-04-20 DIAGNOSIS — O99019 Anemia complicating pregnancy, unspecified trimester: Secondary | ICD-10-CM

## 2020-04-20 LAB — CULTURE, OB URINE

## 2020-04-20 LAB — URINE CULTURE, OB REFLEX: Organism ID, Bacteria: NO GROWTH

## 2020-04-20 MED ORDER — FERROUS SULFATE 325 (65 FE) MG PO TABS: 325.0000 mg | ORAL_TABLET | Freq: Two times a day (BID) | ORAL | 5 refills | Status: DC

## 2020-04-22 LAB — CERVICOVAGINAL ANCILLARY ONLY
Bacterial Vaginitis (gardnerella): POSITIVE — AB
Candida Glabrata: NEGATIVE
Candida Vaginitis: NEGATIVE
Chlamydia: NEGATIVE
Comment: NEGATIVE
Comment: NEGATIVE
Comment: NEGATIVE
Comment: NEGATIVE
Comment: NEGATIVE
Comment: NORMAL
Neisseria Gonorrhea: NEGATIVE
Trichomonas: NEGATIVE

## 2020-04-23 ENCOUNTER — Telehealth: Payer: Self-pay

## 2020-04-23 ENCOUNTER — Other Ambulatory Visit: Payer: Self-pay | Admitting: Obstetrics

## 2020-04-23 DIAGNOSIS — B9689 Other specified bacterial agents as the cause of diseases classified elsewhere: Secondary | ICD-10-CM

## 2020-04-23 MED ORDER — TINIDAZOLE 500 MG PO TABS: 1000.0000 mg | ORAL_TABLET | Freq: Every day | ORAL | 2 refills | Status: DC

## 2020-04-23 NOTE — Telephone Encounter (Signed)
S/w pt and advised of turn around time for [facility] paperwork, advised of results and rx sent.

## 2020-04-24 ENCOUNTER — Encounter: Payer: Self-pay | Admitting: Obstetrics

## 2020-04-25 DIAGNOSIS — Z3493 Encounter for supervision of normal pregnancy, unspecified, third trimester: Secondary | ICD-10-CM

## 2020-05-06 ENCOUNTER — Other Ambulatory Visit: Payer: Self-pay

## 2020-05-06 MED ORDER — METRONIDAZOLE 0.75 % VA GEL
1.0000 | Freq: Every day | VAGINAL | 0 refills | Status: DC
Start: 2020-05-06 — End: 2020-06-19

## 2020-05-06 NOTE — Progress Notes (Signed)
Metrogel sent per pt request to something different for BV treatment, pt reports vomiting with Tindamax. Pt made aware.

## 2020-05-15 ENCOUNTER — Telehealth (INDEPENDENT_AMBULATORY_CARE_PROVIDER_SITE_OTHER): Payer: BC Managed Care – PPO

## 2020-05-15 DIAGNOSIS — Z3A17 17 weeks gestation of pregnancy: Secondary | ICD-10-CM

## 2020-05-15 DIAGNOSIS — O99012 Anemia complicating pregnancy, second trimester: Secondary | ICD-10-CM | POA: Diagnosis not present

## 2020-05-15 DIAGNOSIS — Z20822 Contact with and (suspected) exposure to covid-19: Secondary | ICD-10-CM | POA: Diagnosis not present

## 2020-05-15 DIAGNOSIS — O219 Vomiting of pregnancy, unspecified: Secondary | ICD-10-CM

## 2020-05-15 DIAGNOSIS — B009 Herpesviral infection, unspecified: Secondary | ICD-10-CM

## 2020-05-15 DIAGNOSIS — Z3482 Encounter for supervision of other normal pregnancy, second trimester: Secondary | ICD-10-CM

## 2020-05-15 DIAGNOSIS — Z3480 Encounter for supervision of other normal pregnancy, unspecified trimester: Secondary | ICD-10-CM

## 2020-05-15 DIAGNOSIS — D649 Anemia, unspecified: Secondary | ICD-10-CM

## 2020-05-15 DIAGNOSIS — R110 Nausea: Secondary | ICD-10-CM | POA: Diagnosis not present

## 2020-05-15 DIAGNOSIS — O98312 Other infections with a predominantly sexual mode of transmission complicating pregnancy, second trimester: Secondary | ICD-10-CM | POA: Diagnosis not present

## 2020-05-15 NOTE — Progress Notes (Signed)
OBSTETRICS PRENATAL VIRTUAL VISIT ENCOUNTER NOTE  Provider location: Center for [facility] [place]   I connected with Debbie Greene on 05/15/20 at  8:15 AM EDT by MyChart Video Encounter at home and verified that I am speaking with the correct person using two identifiers.   I discussed the limitations, risks, security and privacy concerns of performing an evaluation and management service virtually and the availability of in person appointments. I also discussed with the patient that there may be a patient responsible charge related to this service. The patient expressed understanding and agreed to proceed. Subjective:  Debbie Greene is a 34 y.o. [phone] at [redacted]w[redacted]d being seen today for ongoing prenatal care.  She is currently monitored for the following issues for this low-risk pregnancy and has Unprotected sexual intercourse; Habitual aborter; Screening for cervical cancer; Anemia; Episodic tension-type headache, not intractable; Multiple joint pain; History of epistaxis; Dyspepsia; HSV (herpes simplex virus) infection; Left leg pain; and Supervision of other normal pregnancy, antepartum on their problem list.  Patient reports nausea and vomiting.   .  .   . Patient reports she has been taking Bonine occasionally because she concludes that if she "is throwing up then the baby doesn't like it."   Patient also reports some cramping, intermittently.  She states it lasts "about 2-3 minutes."  Patient denies vaginal concerns including leaking, discharge, and bleeding. Patient expresses desire for WB.  The following portions of the patient's history were reviewed and updated as appropriate: allergies, current medications, past family history, past medical history, past social history, past surgical history and problem list.   Objective:  There were no vitals filed for this visit.  Fetal Status:           General:  Alert, oriented and cooperative. Patient is in no acute distress.    Respiratory: Normal respiratory effort, no problems with respiration noted  Mental Status: Normal mood and affect. Normal behavior. Normal judgment and thought content.  Rest of physical exam deferred due to type of encounter  Imaging: No results found.  Assessment and Plan:  Pregnancy: [id number] at [redacted]w[redacted]d 1. Supervision of other normal pregnancy, antepartum -Anticipatory guidance for upcoming appts. -Informed that AFP can be drawn at ultrasound appt. Order placed for future release. -Discussed candidacy for WB-No current contradictions.  -Reviewed how WB is an option that is not guaranteed based on presenting risk factors, staff availability, and hospital acuity. -Encouraged to take class as early as possible-already scheduled.   2. Nausea and vomiting during pregnancy prior to [redacted] weeks gestation -Extensive education on how nausea and vomiting is r/t hormonal changes and not fetal desires or rejection for certain foods. -Informed that nausea should have resolved, but since it continues then she should treat. -Instructed to use Bonine as needed and report if symptoms not resolving with necessary usage. -Discussed intake of small meals and snacks (that are high in protein) throughout the day.   Preterm labor symptoms and general obstetric precautions including but not limited to vaginal bleeding, contractions, leaking of fluid and fetal movement were reviewed in detail with the patient. I discussed the assessment and treatment plan with the patient. The patient was provided an opportunity to ask questions and all were answered. The patient agreed with the plan and demonstrated an understanding of the instructions. The patient was advised to call back or seek an in-person office evaluation/go to MAU at [facility] for any urgent or concerning symptoms. Please refer to After Visit Summary  for other counseling recommendations.   I provided 12 minutes of face-to-face time during  this encounter.  Return in about 5 weeks (around 06/19/2020) for Virtual LR-ROB.  Future Appointments  Date Time Provider Department Center  05/27/2020  8:45 AM [facility] US5 [facility]    Cherre Robins, CNM Center for [employer], [facility]

## 2020-05-15 NOTE — Patient Instructions (Signed)

## 2020-05-16 ENCOUNTER — Telehealth: Payer: BC Managed Care – PPO | Admitting: Obstetrics

## 2020-05-27 ENCOUNTER — Other Ambulatory Visit: Payer: Self-pay

## 2020-05-27 ENCOUNTER — Other Ambulatory Visit: Payer: Self-pay | Admitting: *Deleted

## 2020-05-27 ENCOUNTER — Ambulatory Visit: Payer: BC Managed Care – PPO | Attending: Obstetrics

## 2020-05-27 DIAGNOSIS — O98512 Other viral diseases complicating pregnancy, second trimester: Secondary | ICD-10-CM | POA: Diagnosis not present

## 2020-05-27 DIAGNOSIS — Z3480 Encounter for supervision of other normal pregnancy, unspecified trimester: Secondary | ICD-10-CM | POA: Diagnosis not present

## 2020-05-27 DIAGNOSIS — Z3A19 19 weeks gestation of pregnancy: Secondary | ICD-10-CM

## 2020-05-27 DIAGNOSIS — O99012 Anemia complicating pregnancy, second trimester: Secondary | ICD-10-CM

## 2020-05-27 DIAGNOSIS — Z363 Encounter for antenatal screening for malformations: Secondary | ICD-10-CM

## 2020-05-27 DIAGNOSIS — Z362 Encounter for other antenatal screening follow-up: Secondary | ICD-10-CM

## 2020-05-27 DIAGNOSIS — D649 Anemia, unspecified: Secondary | ICD-10-CM

## 2020-05-27 DIAGNOSIS — B009 Herpesviral infection, unspecified: Secondary | ICD-10-CM

## 2020-06-19 ENCOUNTER — Telehealth (INDEPENDENT_AMBULATORY_CARE_PROVIDER_SITE_OTHER): Payer: BC Managed Care – PPO | Admitting: Obstetrics and Gynecology

## 2020-06-19 DIAGNOSIS — B009 Herpesviral infection, unspecified: Secondary | ICD-10-CM | POA: Diagnosis not present

## 2020-06-19 DIAGNOSIS — O98513 Other viral diseases complicating pregnancy, third trimester: Secondary | ICD-10-CM

## 2020-06-19 DIAGNOSIS — Z3A22 22 weeks gestation of pregnancy: Secondary | ICD-10-CM

## 2020-06-19 DIAGNOSIS — O99012 Anemia complicating pregnancy, second trimester: Secondary | ICD-10-CM

## 2020-06-19 DIAGNOSIS — Z3480 Encounter for supervision of other normal pregnancy, unspecified trimester: Secondary | ICD-10-CM

## 2020-06-19 DIAGNOSIS — O09899 Supervision of other high risk pregnancies, unspecified trimester: Secondary | ICD-10-CM

## 2020-06-19 DIAGNOSIS — Z2839 Other underimmunization status: Secondary | ICD-10-CM | POA: Insufficient documentation

## 2020-06-19 DIAGNOSIS — D649 Anemia, unspecified: Secondary | ICD-10-CM

## 2020-06-19 DIAGNOSIS — Z7189 Other specified counseling: Secondary | ICD-10-CM

## 2020-06-19 NOTE — Progress Notes (Signed)
Patient presents for my chart visit Debbie Greene. Patient identified with 2 patient identifiers. Her BP today is 116/83. Patient complains of having dizziness and feeling lightheaded.

## 2020-06-19 NOTE — Progress Notes (Signed)
   TELEHEALTH OBSTETRICS VISIT ENCOUNTER NOTE  I connected with Debbie Greene on 06/19/20 at  8:35 AM EDT by telephone at home and verified that I am speaking with the correct person using two identifiers.   I discussed the limitations, risks, security and privacy concerns of performing an evaluation and management service by telephone and the availability of in person appointments. I also discussed with the patient that there may be a patient responsible charge related to this service. The patient expressed understanding and agreed to proceed.  Provider is in the office, patient is at home.   Subjective:  Debbie Greene is a 34 y.o. [phone] at [redacted]w[redacted]d being followed for ongoing prenatal care.  She is currently monitored for the following issues for this low-risk pregnancy and has Unprotected sexual intercourse; Habitual aborter; Screening for cervical cancer; Anemia; Episodic tension-type headache, not intractable; Multiple joint pain; History of epistaxis; Dyspepsia; HSV (herpes simplex virus) infection; Left leg pain; Supervision of other normal pregnancy, antepartum; and Rubella non-immune status, antepartum on their problem list.  Patient reports no complaints. Reports fetal movement. Denies any contractions, bleeding or leaking of fluid.   The following portions of the patient's history were reviewed and updated as appropriate: allergies, current medications, past family history, past medical history, past social history, past surgical history and problem list.   Objective:   General:  Alert, oriented and cooperative.   Mental Status: Normal mood and affect perceived. Normal judgment and thought content.  Rest of physical exam deferred due to type of encounter  Assessment and Plan:  Pregnancy: [id number] at [redacted]w[redacted]d  1. Supervision of other normal pregnancy, antepartum  Interested in water birth, completed the class  F/u visit in 4 weeks.  [place] scheduled for 06/27/20  2. Rubella  non-immune status, antepartum  Will treat PP  The patient was counseled on the potential benefits and lack of known risks of COVID vaccination, during pregnancy and breastfeeding, on today's visit. The patient's questions and concerns were addressed today, including chances of getting covid after being vaccinated. The patient is still unsure of her decision for vaccination.    bill 28786 with 25 modifier for in-person and CR modifier    Preterm labor symptoms and general obstetric precautions including but not limited to vaginal bleeding, contractions, leaking of fluid and fetal movement were reviewed in detail with the patient.  I discussed the assessment and treatment plan with the patient. The patient was provided an opportunity to ask questions and all were answered. The patient agreed with the plan and demonstrated an understanding of the instructions. The patient was advised to call back or seek an in-person office evaluation/go to MAU at [facility] for any urgent or concerning symptoms. Please refer to After Visit Summary for other counseling recommendations.   I provided 15 minutes of non-face-to-face time during this encounter.  Return in about 4 weeks (around 07/17/2020) for Midwife visit in 4 weeks- patient does not want to see Gerrit Heck. In person for GTT .  Future Appointments  Date Time Provider Department Center  06/27/2020 12:30 PM [facility] NURSE [facility]  06/27/2020 12:45 PM [facility] US5 [facility]    Venia Carbon, NP Center for [employer], [facility]

## 2020-06-27 ENCOUNTER — Other Ambulatory Visit: Payer: Self-pay

## 2020-06-27 ENCOUNTER — Ambulatory Visit: Payer: BC Managed Care – PPO | Admitting: *Deleted

## 2020-06-27 ENCOUNTER — Encounter: Payer: Self-pay | Admitting: *Deleted

## 2020-06-27 ENCOUNTER — Ambulatory Visit: Payer: BC Managed Care – PPO | Attending: Obstetrics and Gynecology

## 2020-06-27 ENCOUNTER — Other Ambulatory Visit: Payer: Self-pay | Admitting: *Deleted

## 2020-06-27 DIAGNOSIS — B009 Herpesviral infection, unspecified: Secondary | ICD-10-CM | POA: Diagnosis not present

## 2020-06-27 DIAGNOSIS — Z3480 Encounter for supervision of other normal pregnancy, unspecified trimester: Secondary | ICD-10-CM

## 2020-06-27 DIAGNOSIS — O99012 Anemia complicating pregnancy, second trimester: Secondary | ICD-10-CM

## 2020-06-27 DIAGNOSIS — Z362 Encounter for other antenatal screening follow-up: Secondary | ICD-10-CM | POA: Diagnosis not present

## 2020-06-27 DIAGNOSIS — O43122 Velamentous insertion of umbilical cord, second trimester: Secondary | ICD-10-CM | POA: Diagnosis not present

## 2020-06-27 DIAGNOSIS — Z283 Underimmunization status: Secondary | ICD-10-CM | POA: Diagnosis not present

## 2020-06-27 DIAGNOSIS — Z3A23 23 weeks gestation of pregnancy: Secondary | ICD-10-CM

## 2020-06-27 DIAGNOSIS — O99891 Other specified diseases and conditions complicating pregnancy: Secondary | ICD-10-CM | POA: Diagnosis not present

## 2020-06-27 DIAGNOSIS — O98512 Other viral diseases complicating pregnancy, second trimester: Secondary | ICD-10-CM

## 2020-06-27 DIAGNOSIS — Z2839 Other underimmunization status: Secondary | ICD-10-CM

## 2020-07-02 NOTE — Telephone Encounter (Signed)
These symptoms are normal in pregnancy.  Eat more fiber.  Increase fluids.  May take Colace 100 mg twice a day.

## 2020-07-22 ENCOUNTER — Telehealth: Payer: Self-pay

## 2020-07-22 DIAGNOSIS — B009 Herpesviral infection, unspecified: Secondary | ICD-10-CM

## 2020-07-22 MED ORDER — VALACYCLOVIR HCL 1 G PO TABS: 1000.0000 mg | ORAL_TABLET | Freq: Every day | ORAL | 5 refills | Status: DC

## 2020-07-22 NOTE — Telephone Encounter (Signed)
Patient is requesting a refill on valtrex.  

## 2020-07-23 ENCOUNTER — Other Ambulatory Visit: Payer: Self-pay

## 2020-07-23 DIAGNOSIS — B009 Herpesviral infection, unspecified: Secondary | ICD-10-CM

## 2020-07-23 MED ORDER — VALACYCLOVIR HCL 1 G PO TABS: 1000.0000 mg | ORAL_TABLET | Freq: Every day | ORAL | 5 refills | Status: DC

## 2020-07-30 ENCOUNTER — Other Ambulatory Visit: Payer: Self-pay

## 2020-07-30 ENCOUNTER — Inpatient Hospital Stay (HOSPITAL_COMMUNITY)
Admission: AD | Admit: 2020-07-30 | Discharge: 2020-07-30 | Disposition: A | Discharge status: Home / Self Care | Payer: BC Managed Care – PPO | Attending: Obstetrics and Gynecology | Admitting: Obstetrics and Gynecology

## 2020-07-30 ENCOUNTER — Encounter (HOSPITAL_COMMUNITY): Payer: Self-pay | Admitting: Obstetrics and Gynecology

## 2020-07-30 DIAGNOSIS — O99013 Anemia complicating pregnancy, third trimester: Secondary | ICD-10-CM | POA: Diagnosis not present

## 2020-07-30 DIAGNOSIS — Z3A28 28 weeks gestation of pregnancy: Secondary | ICD-10-CM | POA: Diagnosis not present

## 2020-07-30 DIAGNOSIS — Z20822 Contact with and (suspected) exposure to covid-19: Secondary | ICD-10-CM | POA: Diagnosis not present

## 2020-07-30 DIAGNOSIS — R0602 Shortness of breath: Secondary | ICD-10-CM | POA: Insufficient documentation

## 2020-07-30 DIAGNOSIS — R42 Dizziness and giddiness: Secondary | ICD-10-CM | POA: Diagnosis not present

## 2020-07-30 DIAGNOSIS — D649 Anemia, unspecified: Secondary | ICD-10-CM | POA: Insufficient documentation

## 2020-07-30 DIAGNOSIS — O26893 Other specified pregnancy related conditions, third trimester: Secondary | ICD-10-CM

## 2020-07-30 DIAGNOSIS — O99513 Diseases of the respiratory system complicating pregnancy, third trimester: Secondary | ICD-10-CM | POA: Diagnosis not present

## 2020-07-30 DIAGNOSIS — K30 Functional dyspepsia: Secondary | ICD-10-CM | POA: Insufficient documentation

## 2020-07-30 DIAGNOSIS — O98513 Other viral diseases complicating pregnancy, third trimester: Secondary | ICD-10-CM | POA: Insufficient documentation

## 2020-07-30 DIAGNOSIS — O99019 Anemia complicating pregnancy, unspecified trimester: Secondary | ICD-10-CM

## 2020-07-30 DIAGNOSIS — Z79899 Other long term (current) drug therapy: Secondary | ICD-10-CM | POA: Diagnosis not present

## 2020-07-30 LAB — COMPREHENSIVE METABOLIC PANEL
ALT: 12 U/L (ref 0–44)
AST: 15 U/L (ref 15–41)
Albumin: 2.8 g/dL — ABNORMAL LOW (ref 3.5–5.0)
Alkaline Phosphatase: 67 U/L (ref 38–126)
Anion gap: 8 (ref 5–15)
BUN: 6 mg/dL (ref 6–20)
CO2: 22 mmol/L (ref 22–32)
Calcium: 8.3 mg/dL — ABNORMAL LOW (ref 8.9–10.3)
Chloride: 106 mmol/L (ref 98–111)
Creatinine, Ser: 0.63 mg/dL (ref 0.44–1.00)
GFR, Estimated: >60 mL/min (ref >60)
Glucose, Bld: 91 mg/dL (ref 70–99)
Potassium: 3.5 mmol/L (ref 3.5–5.1)
Sodium: 136 mmol/L (ref 135–145)
Total Bilirubin: 0.4 mg/dL (ref 0.3–1.2)
Total Protein: 6 g/dL — ABNORMAL LOW (ref 6.5–8.1)

## 2020-07-30 LAB — CBC
HCT: 28.3 % — ABNORMAL LOW (ref 36.0–46.0)
Hemoglobin: 9.1 g/dL — ABNORMAL LOW (ref 12.0–15.0)
MCH: 25.6 pg — ABNORMAL LOW (ref 26.0–34.0)
MCHC: 32.2 g/dL (ref 30.0–36.0)
MCV: 79.5 fL — ABNORMAL LOW (ref 80.0–100.0)
Platelets: 277 10*3/uL (ref 150–400)
RBC: 3.56 MIL/uL — ABNORMAL LOW (ref 3.87–5.11)
RDW: 14.5 % (ref 11.5–15.5)
WBC: 8.8 10*3/uL (ref 4.0–10.5)
nRBC: 0 % (ref 0.0–0.2)

## 2020-07-30 LAB — URINALYSIS, ROUTINE W REFLEX MICROSCOPIC
Bacteria, UA: NONE SEEN
Bilirubin Urine: NEGATIVE
Glucose, UA: >=500 mg/dL — AB
Hgb urine dipstick: NEGATIVE
Ketones, ur: 5 mg/dL — AB
Leukocytes,Ua: NEGATIVE
Nitrite: NEGATIVE
Protein, ur: 30 mg/dL — AB
Specific Gravity, Urine: 1.029 (ref 1.005–1.030)
pH: 5 (ref 5.0–8.0)

## 2020-07-30 LAB — RESPIRATORY PANEL BY RT PCR (FLU A&B, COVID)
Influenza A by PCR: NEGATIVE
Influenza B by PCR: NEGATIVE
SARS Coronavirus 2 by RT PCR: NEGATIVE

## 2020-07-30 MED ORDER — CONCEPT OB 130-92.4-1 MG PO CAPS: 1.0000 | ORAL_CAPSULE | Freq: Every day | ORAL | 11 refills | Status: AC

## 2020-07-30 MED ORDER — FERROUS SULFATE 325 (65 FE) MG PO TABS: 325.0000 mg | ORAL_TABLET | ORAL | 5 refills | Status: DC

## 2020-07-30 MED ORDER — FERROUS SULFATE 325 (65 FE) MG PO TABS: 325.0000 mg | ORAL_TABLET | ORAL | 5 refills | Status: AC

## 2020-07-30 MED ORDER — CONCEPT OB 130-92.4-1 MG PO CAPS: 1.0000 | ORAL_CAPSULE | Freq: Every day | ORAL | 11 refills | Status: DC

## 2020-07-30 NOTE — MAU Provider Note (Signed)
Chief Complaint:  Fatigue, Shortness of Breath, and Blurred Vision   First Provider Initiated Contact with Patient 07/30/20 1905     SOB, dizziness, seeing spots, occurs at rest  HPI: Debbie Greene is a 34 y.o. [id number] at 1747w1d by LMP who presents to maternity admissions reporting shortness of breath gradually increasing over the last 2 weeks. She also reports fatigue, dizziness and occasional blurred vision. These symptoms occur both when standing and when sitting or lying down.  There are no other symptoms. She has not tried any treatments. She is prescribed iron for anemia in pregnancy but is unable to take it daily due to GI upset.  She is taking a gummy prenatal vitamin with folic acid but no iron.  She reports good fetal movement and denies cramping/ contractions.      HPI  Past Medical History: Past Medical History:  Diagnosis Date  . Herpes   . High risk sexual behavior 10/01/2016  . Tuberculosis     Past obstetric history: OB History  Gravida Para Term Preterm AB Living  6 1 1   4 1   SAB TAB Ectopic Multiple Live Births    4     1    # Outcome Date GA Lbr Len/2nd Weight Sex Delivery Anes PTL Lv  6 Current           5 Term 06/04/08 6127w0d  3232 g M Vag-Spont EPI  LIV  4 TAB           3 TAB           2 TAB           1 TAB             Past Surgical History: Past Surgical History:  Procedure Laterality Date  . THERAPEUTIC ABORTION     Pt states that she has had 2 abortions.     Family History: Family History  Problem Relation Age of Onset  . Cancer Maternal Grandmother        Breast  . Asthma Mother   . Hypertension Mother     Social History: Social History   Tobacco Use  . Smoking status: Never Smoker  . Smokeless tobacco: Never Used  Vaping Use  . Vaping Use: Never used  Substance Use Topics  . Alcohol use: Not Currently    Comment: Occassionally.   . Drug use: No    Allergies: No Known Allergies  Meds:  Medications Prior to Admission   Medication Sig Dispense Refill Last Dose  . loratadine (CLARITIN) 10 MG tablet Take 1 tablet (10 mg total) by mouth daily. 30 tablet 0 Past Month at Unknown time  . Prenatal Vit-Fe Fumarate-FA (MULTIVITAMIN-PRENATAL) 27-0.8 MG TABS tablet Take 1 tablet by mouth daily at 12 noon.   07/30/2020 at Unknown time  . valACYclovir (VALTREX) 1000 MG tablet Take 1 tablet (1,000 mg total) by mouth daily. 30 tablet 5 Past Month at Unknown time  . famotidine (PEPCID) 20 MG tablet Take 1 tablet (20 mg total) by mouth at bedtime. 30 tablet 0   . [DISCONTINUED] ferrous sulfate 325 (65 FE) MG tablet Take 1 tablet (325 mg total) by mouth 2 (two) times daily with a meal. 60 tablet 5     ROS:  Review of Systems  Constitutional: Positive for fatigue. Negative for chills and fever.  Eyes: Positive for visual disturbance.  Respiratory: Positive for shortness of breath. Negative for chest tightness.   Cardiovascular: Negative for chest pain.  Gastrointestinal: Negative for abdominal pain, nausea and vomiting.  Genitourinary: Negative for difficulty urinating, dysuria, flank pain, pelvic pain, vaginal bleeding, vaginal discharge and vaginal pain.  Neurological: Positive for dizziness. Negative for headaches.  Psychiatric/Behavioral: Negative.      I have reviewed patient's Past Medical Hx, Surgical Hx, Family Hx, Social Hx, medications and allergies.   Physical Exam   Patient Vitals for the past 24 hrs:  BP Temp Temp src Pulse Resp SpO2  07/30/20 2048 115/76 -- -- -- 18 100 %  07/30/20 1733 122/69 98.6 F (37 C) Oral (!) 103 16 --   Constitutional: Well-developed, well-nourished female in no acute distress.  HEART: normal rate, heart sounds, regular rhythm RESP: normal effort, lung sounds clear and equal bilaterally GI: Abd soft, non-tender, gravid appropriate for gestational age.  MS: Extremities nontender, no edema, normal ROM Neurologic: Alert and oriented x 4.  GU: Neg CVAT.   FHT:  Baseline 140  , moderate variability, accelerations present, no decelerations Contractions: None on toco or to palpation   Labs: Results for orders placed or performed during the hospital encounter of 07/30/20 (from the past 24 hour(s))  Urinalysis, Routine w reflex microscopic Urine, Clean Catch     Status: Abnormal   Collection Time: 07/30/20  5:28 PM  Result Value Ref Range   Color, Urine YELLOW YELLOW   APPearance CLEAR CLEAR   Specific Gravity, Urine 1.029 1.005 - 1.030   pH 5.0 5.0 - 8.0   Glucose, UA >=500 (A) NEGATIVE mg/dL   Hgb urine dipstick NEGATIVE NEGATIVE   Bilirubin Urine NEGATIVE NEGATIVE   Ketones, ur 5 (A) NEGATIVE mg/dL   Protein, ur 30 (A) NEGATIVE mg/dL   Nitrite NEGATIVE NEGATIVE   Leukocytes,Ua NEGATIVE NEGATIVE   RBC / HPF 0-5 0 - 5 RBC/hpf   WBC, UA 0-5 0 - 5 WBC/hpf   Bacteria, UA NONE SEEN NONE SEEN   Squamous Epithelial / LPF 0-5 0 - 5   Mucus PRESENT   CBC     Status: Abnormal   Collection Time: 07/30/20  6:28 PM  Result Value Ref Range   WBC 8.8 4.0 - 10.5 K/uL   RBC 3.56 (L) 3.87 - 5.11 MIL/uL   Hemoglobin 9.1 (L) 12.0 - 15.0 g/dL   HCT 32.3 (L) 36 - 46 %   MCV 79.5 (L) 80.0 - 100.0 fL   MCH 25.6 (L) 26.0 - 34.0 pg   MCHC 32.2 30.0 - 36.0 g/dL   RDW 55.7 32.2 - 02.5 %   Platelets 277 150 - 400 K/uL   nRBC 0.0 0.0 - 0.2 %  Comprehensive metabolic panel     Status: Abnormal   Collection Time: 07/30/20  6:28 PM  Result Value Ref Range   Sodium 136 135 - 145 mmol/L   Potassium 3.5 3.5 - 5.1 mmol/L   Chloride 106 98 - 111 mmol/L   CO2 22 22 - 32 mmol/L   Glucose, Bld 91 70 - 99 mg/dL   BUN 6 6 - 20 mg/dL   Creatinine, Ser 4.27 0.44 - 1.00 mg/dL   Calcium 8.3 (L) 8.9 - 10.3 mg/dL   Total Protein 6.0 (L) 6.5 - 8.1 g/dL   Albumin 2.8 (L) 3.5 - 5.0 g/dL   AST 15 15 - 41 U/L   ALT 12 0 - 44 U/L   Alkaline Phosphatase 67 38 - 126 U/L   Total Bilirubin 0.4 0.3 - 1.2 mg/dL   GFR, Estimated >06 >23 mL/min   Anion  gap 8 5 - 15  Respiratory Panel by RT PCR  (Flu A&B, Covid) - Nasopharyngeal Swab     Status: None   Collection Time: 07/30/20  7:07 PM   Specimen: Nasopharyngeal Swab  Result Value Ref Range   SARS Coronavirus 2 by RT PCR NEGATIVE NEGATIVE   Influenza A by PCR NEGATIVE NEGATIVE   Influenza B by PCR NEGATIVE NEGATIVE   O/Negative/-- (07/02 0944)  Imaging:  No results found.  MAU Course/MDM: Orders Placed This Encounter  Procedures  . Respiratory Panel by RT PCR (Flu A&B, Covid) - Nasopharyngeal Swab  . Urinalysis, Routine w reflex microscopic Urine, Clean Catch  . CBC  . Comprehensive metabolic panel  . Discharge patient    Meds ordered this encounter  Medications  . DISCONTD: ferrous sulfate 325 (65 FE) MG tablet    Sig: Take 1 tablet (325 mg total) by mouth every other day.    Dispense:  60 tablet    Refill:  5    Order Specific Question:   Supervising Provider    Answer:   ERVIN, MICHAEL L [[id number]]  . DISCONTD: Prenat w/o A Vit-FeFum-FePo-FA (CONCEPT OB) 130-92.4-1 MG CAPS    Sig: Take 1 capsule by mouth daily.    Dispense:  30 capsule    Refill:  11    Order Specific Question:   Supervising Provider    Answer:   Alysia Penna, MICHAEL L [[id number]]  . Prenat w/o A Vit-FeFum-FePo-FA (CONCEPT OB) 130-92.4-1 MG CAPS    Sig: Take 1 capsule by mouth daily.    Dispense:  30 capsule    Refill:  11    Order Specific Question:   Supervising Provider    Answer:   Alysia Penna, MICHAEL L [[id number]]  . ferrous sulfate 325 (65 FE) MG tablet    Sig: Take 1 tablet (325 mg total) by mouth every other day.    Dispense:  60 tablet    Refill:  5    Order Specific Question:   Supervising Provider    Answer:   Nettie Elm L [[id number]]     NST reviewed and reactive No obstetric complaints No acute findings on exam, gradual onset of SOB, no acute changes Pt with worsening anemia, likely the cause of pt symptoms Consult Dr Shawnie Pons with presentation, exam findings and test results.  D/C home with change in PO iron regimen.  Rx for PNV with iron plus  take ferrous sulfate every other day consistently F/u at [facility] next week Return to MAU with worsening symptoms  Pt discharge with strict return precautions.    Assessment: 1. Shortness of breath due to pregnancy in third trimester   2. Anemia complicating pregnancy in third trimester   3. Anemia affecting pregnancy, antepartum   4. [redacted] weeks gestation of pregnancy     Plan: Discharge home Labor precautions and fetal kick counts  Follow-up Information    [facility] Follow up.   Why: Follow up in 1 week, the office will call you to schedule your prenatal visit and glucose test. Return to MAU as needed for emergencies.  Contact information: [address] [place] [address] [phone]             Allergies as of 07/30/2020   No Known Allergies     Medication List    STOP taking these medications   multivitamin-prenatal 27-0.8 MG Tabs tablet     TAKE these medications   Concept OB 130-92.4-1 MG Caps  Take 1 capsule by mouth daily.   famotidine 20 MG tablet Commonly known as: PEPCID Take 1 tablet (20 mg total) by mouth at bedtime.   ferrous sulfate 325 (65 FE) MG tablet Take 1 tablet (325 mg total) by mouth every other day. What changed: when to take this   loratadine 10 MG tablet Commonly known as: CLARITIN Take 1 tablet (10 mg total) by mouth daily.   valACYclovir 1000 MG tablet Commonly known as: VALTREX Take 1 tablet (1,000 mg total) by mouth daily.       Sharen Counter Certified Nurse-Midwife 07/30/2020 9:00 PM

## 2020-07-30 NOTE — MAU Note (Signed)
.   Debbie Greene is a 34 y.o. at [redacted]w[redacted]d here in MAU reporting: she has had SOB, weakness and seeing spots for a couple of months. Pt has had a negative covid test over the last month. Denies any VB or LOF LMP: 01/15/20 Onset of complaint: ongoing Pain score: 0 Vitals:   07/30/20 1733  BP: 122/69  Pulse: (!) 103  Resp: 16  Temp: 98.6 F (37 C)     FHT:140 Lab orders placed from triage: UA

## 2020-08-04 ENCOUNTER — Telehealth: Payer: Self-pay | Admitting: Obstetrics

## 2020-08-08 ENCOUNTER — Ambulatory Visit: Payer: BC Managed Care – PPO | Attending: Obstetrics and Gynecology

## 2020-08-08 ENCOUNTER — Ambulatory Visit: Payer: BC Managed Care – PPO | Admitting: *Deleted

## 2020-08-08 ENCOUNTER — Other Ambulatory Visit: Payer: Self-pay

## 2020-08-08 ENCOUNTER — Other Ambulatory Visit: Payer: Self-pay | Admitting: *Deleted

## 2020-08-08 ENCOUNTER — Encounter: Payer: Self-pay | Admitting: *Deleted

## 2020-08-08 DIAGNOSIS — O321XX0 Maternal care for breech presentation, not applicable or unspecified: Secondary | ICD-10-CM

## 2020-08-08 DIAGNOSIS — O98513 Other viral diseases complicating pregnancy, third trimester: Secondary | ICD-10-CM | POA: Diagnosis not present

## 2020-08-08 DIAGNOSIS — Z362 Encounter for other antenatal screening follow-up: Secondary | ICD-10-CM | POA: Insufficient documentation

## 2020-08-08 DIAGNOSIS — O43129 Velamentous insertion of umbilical cord, unspecified trimester: Secondary | ICD-10-CM

## 2020-08-08 DIAGNOSIS — Z283 Underimmunization status: Secondary | ICD-10-CM

## 2020-08-08 DIAGNOSIS — B009 Herpesviral infection, unspecified: Secondary | ICD-10-CM

## 2020-08-08 DIAGNOSIS — O99891 Other specified diseases and conditions complicating pregnancy: Secondary | ICD-10-CM | POA: Insufficient documentation

## 2020-08-08 DIAGNOSIS — Z2839 Other underimmunization status: Secondary | ICD-10-CM

## 2020-08-08 DIAGNOSIS — Z3A29 29 weeks gestation of pregnancy: Secondary | ICD-10-CM

## 2020-08-08 DIAGNOSIS — D649 Anemia, unspecified: Secondary | ICD-10-CM

## 2020-08-08 DIAGNOSIS — O99013 Anemia complicating pregnancy, third trimester: Secondary | ICD-10-CM | POA: Diagnosis not present

## 2020-08-08 DIAGNOSIS — Z3480 Encounter for supervision of other normal pregnancy, unspecified trimester: Secondary | ICD-10-CM | POA: Diagnosis not present

## 2020-08-11 ENCOUNTER — Telehealth: Payer: Self-pay | Admitting: Advanced Practice Midwife

## 2020-08-11 ENCOUNTER — Other Ambulatory Visit: Payer: BC Managed Care – PPO

## 2020-08-11 ENCOUNTER — Encounter: Payer: BC Managed Care – PPO | Admitting: Advanced Practice Midwife

## 2020-08-11 NOTE — Telephone Encounter (Signed)
Returned call to patient requested in MyChart message.  Pt with dizziness and shortness of breath in pregnancy due to anemia.  Hgb 9.1 on 10/13.  She applied for FMLA for work and was approved for 1 day per month due to her symptoms.  She has missed 1-2 additional days total.  I will approve these days with the diagnosis of anemia.  Pt to send message to office with dates of work missed. Please forward her information to me and I will send her a letter for her work. If her job needs additional paperwork, please forward to me. Thank you.

## 2020-08-20 ENCOUNTER — Ambulatory Visit: Payer: BC Managed Care – PPO

## 2020-08-27 ENCOUNTER — Encounter (HOSPITAL_COMMUNITY): Payer: Self-pay | Admitting: Obstetrics and Gynecology

## 2020-08-27 ENCOUNTER — Inpatient Hospital Stay (HOSPITAL_COMMUNITY)
Admission: AD | Admit: 2020-08-27 | Discharge: 2020-08-27 | Disposition: A | Discharge status: Home / Self Care | Payer: BC Managed Care – PPO | Attending: Obstetrics and Gynecology | Admitting: Obstetrics and Gynecology

## 2020-08-27 DIAGNOSIS — O99891 Other specified diseases and conditions complicating pregnancy: Secondary | ICD-10-CM | POA: Diagnosis not present

## 2020-08-27 DIAGNOSIS — O26893 Other specified pregnancy related conditions, third trimester: Secondary | ICD-10-CM | POA: Diagnosis not present

## 2020-08-27 DIAGNOSIS — W19XXXA Unspecified fall, initial encounter: Secondary | ICD-10-CM | POA: Insufficient documentation

## 2020-08-27 DIAGNOSIS — Z3A32 32 weeks gestation of pregnancy: Secondary | ICD-10-CM | POA: Diagnosis not present

## 2020-08-27 NOTE — MAU Note (Signed)
.   Debbie Greene is a 34 y.o. at [redacted]w[redacted]d here in MAU reporting: she fell around 4 o'clock today and landed on her right lower back. She reports pain at the site. Denies any VB or LOF. Has not felt the baby move since the fall  Onset of complaint: today at 4 Pain score: 8 Vitals:   08/27/20 1638  BP: 130/87  Pulse: 86  Resp: 18  Temp: 98.7 F (37.1 C)  SpO2: 99%     FHT: Lab orders placed from triage:

## 2020-08-27 NOTE — MAU Provider Note (Signed)
History     CSN: [id number]  Arrival date and time: 08/27/20 1555   None     Chief Complaint  Patient presents with  . Fall   HPI  Patient is a 34 y/o [id number] at [redacted]w[redacted]d who presents after falling at home this afternoon. Fell around 3pm. Patient reports she missed a stair and fell on her right side of back. Did not strike abdomen. Did not strike or injure any other areas. Says she is feeling fine overall but wanted to make sure that baby is okay. Feeling normal baby movement at this time. No vaginal bleeding or leakage of fluid.    Patient recently transferred care to [facility].    OB History    Gravida  6   Para  1   Term  1   Preterm      AB  4   Living  1     SAB      TAB  4   Ectopic      Multiple      Live Births  1           Past Medical History:  Diagnosis Date  . Herpes   . High risk sexual behavior 10/01/2016  . Tuberculosis     Past Surgical History:  Procedure Laterality Date  . THERAPEUTIC ABORTION     Pt states that she has had 2 abortions.     Family History  Problem Relation Age of Onset  . Cancer Maternal Grandmother        Breast  . Asthma Mother   . Hypertension Mother     Social History   Tobacco Use  . Smoking status: Never Smoker  . Smokeless tobacco: Never Used  Vaping Use  . Vaping Use: Never used  Substance Use Topics  . Alcohol use: Not Currently    Comment: Occassionally.   . Drug use: No    Allergies: No Known Allergies  Medications Prior to Admission  Medication Sig Dispense Refill Last Dose  . ferrous sulfate 325 (65 FE) MG tablet Take 1 tablet (325 mg total) by mouth every other day. 60 tablet 5 08/27/2020 at Unknown time  . Prenat w/o A Vit-FeFum-FePo-FA (CONCEPT OB) 130-92.4-1 MG CAPS Take 1 capsule by mouth daily. 30 capsule 11 08/27/2020 at Unknown time  . valACYclovir (VALTREX) 1000 MG tablet Take 1 tablet (1,000 mg total) by mouth daily. 30 tablet 5 Past Week at Unknown time  .  famotidine (PEPCID) 20 MG tablet Take 1 tablet (20 mg total) by mouth at bedtime. (Patient not taking: Reported on 08/08/2020) 30 tablet 0   . loratadine (CLARITIN) 10 MG tablet Take 1 tablet (10 mg total) by mouth daily. (Patient not taking: Reported on 08/08/2020) 30 tablet 0     Review of Systems  Constitutional: Negative for chills and fever.  Eyes: Negative for visual disturbance.  Respiratory: Negative for chest tightness and shortness of breath.   Gastrointestinal: Negative for abdominal pain, nausea and vomiting.  Genitourinary: Negative for flank pain.  Musculoskeletal: Negative for back pain, myalgias and neck pain.  Skin: Negative for rash.  Neurological: Negative for dizziness and headaches.   Physical Exam   Blood pressure 130/87, pulse 86, temperature 98.7 F (37.1 C), resp. rate 18, height 5\' 7"  (1.702 m), weight 88.9 kg, last menstrual period 01/15/2020, SpO2 99 %.  Physical Exam Vitals and nursing note reviewed.  Constitutional:      Appearance: Normal appearance.  Pulmonary:  Effort: Pulmonary effort is normal.  Abdominal:     General: Bowel sounds are normal.     Palpations: Abdomen is soft. There is no mass.     Tenderness: There is no guarding or rebound.  Musculoskeletal:        General: Normal range of motion.     Comments: No tenderness to palpation of cervical, thoracic, or lumbar vertebrae. Some tenderness to deep palpation of right lumbar back.   Neurological:     Mental Status: She is alert.  Psychiatric:        Mood and Affect: Mood normal.        Behavior: Behavior normal.     MAU Course  Procedures  MDM Patient evaluated at bedside Placed on fetal monitoring. NST reactive - baseline 135, mod variability, pos accels, neg decels     Assessment and Plan   Fall, no abdominal contact, [redacted] weeks gestation -patient asymptomatic, reactive NST on prolonged monitoring -stable for d/c home. Discussed return precautions w patient and she  verbalizes understanding -DC from MAU   Gita Kudo 08/27/2020, 4:56 PM

## 2020-09-02 DIAGNOSIS — Z0184 Encounter for antibody response examination: Secondary | ICD-10-CM | POA: Diagnosis not present

## 2020-09-02 DIAGNOSIS — Z331 Pregnant state, incidental: Secondary | ICD-10-CM | POA: Diagnosis not present

## 2020-09-02 DIAGNOSIS — Z3493 Encounter for supervision of normal pregnancy, unspecified, third trimester: Secondary | ICD-10-CM | POA: Diagnosis not present

## 2020-09-02 DIAGNOSIS — Z0183 Encounter for blood typing: Secondary | ICD-10-CM | POA: Diagnosis not present

## 2020-09-02 LAB — OB RESULTS CONSOLE HEPATITIS B SURFACE ANTIGEN: Hepatitis B Surface Ag: NEGATIVE

## 2020-09-02 LAB — OB RESULTS CONSOLE GC/CHLAMYDIA
Chlamydia: NEGATIVE
Gonorrhea: NEGATIVE

## 2020-09-02 LAB — OB RESULTS CONSOLE RUBELLA ANTIBODY, IGM: Rubella: NON-IMMUNE/NOT IMMUNE

## 2020-09-08 LAB — OB RESULTS CONSOLE RPR: RPR: NONREACTIVE

## 2020-09-08 LAB — OB RESULTS CONSOLE HIV ANTIBODY (ROUTINE TESTING): HIV: NONREACTIVE

## 2020-09-17 DIAGNOSIS — O99810 Abnormal glucose complicating pregnancy: Secondary | ICD-10-CM | POA: Diagnosis not present

## 2020-09-19 ENCOUNTER — Other Ambulatory Visit: Payer: Self-pay

## 2020-09-19 ENCOUNTER — Ambulatory Visit: Payer: BC Managed Care – PPO | Attending: Obstetrics

## 2020-09-19 ENCOUNTER — Ambulatory Visit: Payer: BC Managed Care – PPO | Admitting: *Deleted

## 2020-09-19 ENCOUNTER — Encounter: Payer: Self-pay | Admitting: *Deleted

## 2020-09-19 ENCOUNTER — Other Ambulatory Visit: Payer: Self-pay | Admitting: *Deleted

## 2020-09-19 DIAGNOSIS — D649 Anemia, unspecified: Secondary | ICD-10-CM

## 2020-09-19 DIAGNOSIS — Z2839 Other underimmunization status: Secondary | ICD-10-CM

## 2020-09-19 DIAGNOSIS — Z3480 Encounter for supervision of other normal pregnancy, unspecified trimester: Secondary | ICD-10-CM | POA: Diagnosis not present

## 2020-09-19 DIAGNOSIS — Z3A35 35 weeks gestation of pregnancy: Secondary | ICD-10-CM

## 2020-09-19 DIAGNOSIS — O43123 Velamentous insertion of umbilical cord, third trimester: Secondary | ICD-10-CM | POA: Diagnosis not present

## 2020-09-19 DIAGNOSIS — O98513 Other viral diseases complicating pregnancy, third trimester: Secondary | ICD-10-CM

## 2020-09-19 DIAGNOSIS — O99891 Other specified diseases and conditions complicating pregnancy: Secondary | ICD-10-CM | POA: Insufficient documentation

## 2020-09-19 DIAGNOSIS — Z362 Encounter for other antenatal screening follow-up: Secondary | ICD-10-CM

## 2020-09-19 DIAGNOSIS — O43129 Velamentous insertion of umbilical cord, unspecified trimester: Secondary | ICD-10-CM | POA: Insufficient documentation

## 2020-09-19 DIAGNOSIS — Z283 Underimmunization status: Secondary | ICD-10-CM | POA: Insufficient documentation

## 2020-09-19 DIAGNOSIS — B009 Herpesviral infection, unspecified: Secondary | ICD-10-CM

## 2020-09-19 DIAGNOSIS — O99013 Anemia complicating pregnancy, third trimester: Secondary | ICD-10-CM

## 2020-09-19 NOTE — Progress Notes (Signed)
C/o" constant lower back and right lower abd pain since last night."

## 2020-09-25 DIAGNOSIS — Z3493 Encounter for supervision of normal pregnancy, unspecified, third trimester: Secondary | ICD-10-CM | POA: Diagnosis not present

## 2020-09-26 ENCOUNTER — Ambulatory Visit: Payer: BC Managed Care – PPO | Admitting: *Deleted

## 2020-09-26 ENCOUNTER — Encounter: Payer: Self-pay | Admitting: *Deleted

## 2020-09-26 ENCOUNTER — Other Ambulatory Visit: Payer: Self-pay

## 2020-09-26 ENCOUNTER — Ambulatory Visit: Payer: BC Managed Care – PPO | Attending: Obstetrics and Gynecology

## 2020-09-26 DIAGNOSIS — O98513 Other viral diseases complicating pregnancy, third trimester: Secondary | ICD-10-CM

## 2020-09-26 DIAGNOSIS — O99891 Other specified diseases and conditions complicating pregnancy: Secondary | ICD-10-CM | POA: Insufficient documentation

## 2020-09-26 DIAGNOSIS — Z283 Underimmunization status: Secondary | ICD-10-CM | POA: Insufficient documentation

## 2020-09-26 DIAGNOSIS — D649 Anemia, unspecified: Secondary | ICD-10-CM

## 2020-09-26 DIAGNOSIS — Z3480 Encounter for supervision of other normal pregnancy, unspecified trimester: Secondary | ICD-10-CM

## 2020-09-26 DIAGNOSIS — O99013 Anemia complicating pregnancy, third trimester: Secondary | ICD-10-CM | POA: Diagnosis not present

## 2020-09-26 DIAGNOSIS — O43123 Velamentous insertion of umbilical cord, third trimester: Secondary | ICD-10-CM | POA: Insufficient documentation

## 2020-09-26 DIAGNOSIS — O09899 Supervision of other high risk pregnancies, unspecified trimester: Secondary | ICD-10-CM

## 2020-09-26 DIAGNOSIS — B009 Herpesviral infection, unspecified: Secondary | ICD-10-CM

## 2020-09-26 DIAGNOSIS — Z3A36 36 weeks gestation of pregnancy: Secondary | ICD-10-CM

## 2020-10-03 ENCOUNTER — Ambulatory Visit: Payer: BC Managed Care – PPO | Admitting: *Deleted

## 2020-10-03 ENCOUNTER — Other Ambulatory Visit: Payer: Self-pay

## 2020-10-03 ENCOUNTER — Encounter: Payer: Self-pay | Admitting: *Deleted

## 2020-10-03 ENCOUNTER — Telehealth: Payer: Self-pay

## 2020-10-03 ENCOUNTER — Ambulatory Visit: Payer: BC Managed Care – PPO | Attending: Obstetrics and Gynecology

## 2020-10-03 DIAGNOSIS — Z3480 Encounter for supervision of other normal pregnancy, unspecified trimester: Secondary | ICD-10-CM

## 2020-10-03 DIAGNOSIS — Z2839 Other underimmunization status: Secondary | ICD-10-CM

## 2020-10-09 ENCOUNTER — Ambulatory Visit: Payer: BC Managed Care – PPO | Admitting: *Deleted

## 2020-10-09 ENCOUNTER — Encounter: Payer: Self-pay | Admitting: *Deleted

## 2020-10-09 ENCOUNTER — Ambulatory Visit (HOSPITAL_BASED_OUTPATIENT_CLINIC_OR_DEPARTMENT_OTHER): Payer: BC Managed Care – PPO

## 2020-10-09 ENCOUNTER — Other Ambulatory Visit: Payer: Self-pay

## 2020-10-09 DIAGNOSIS — Z6791 Unspecified blood type, Rh negative: Secondary | ICD-10-CM | POA: Diagnosis not present

## 2020-10-09 DIAGNOSIS — O43123 Velamentous insertion of umbilical cord, third trimester: Secondary | ICD-10-CM

## 2020-10-09 DIAGNOSIS — O98513 Other viral diseases complicating pregnancy, third trimester: Secondary | ICD-10-CM

## 2020-10-09 DIAGNOSIS — O26893 Other specified pregnancy related conditions, third trimester: Secondary | ICD-10-CM | POA: Diagnosis not present

## 2020-10-09 DIAGNOSIS — Z283 Underimmunization status: Secondary | ICD-10-CM | POA: Insufficient documentation

## 2020-10-09 DIAGNOSIS — Z20822 Contact with and (suspected) exposure to covid-19: Secondary | ICD-10-CM | POA: Diagnosis not present

## 2020-10-09 DIAGNOSIS — O09899 Supervision of other high risk pregnancies, unspecified trimester: Secondary | ICD-10-CM

## 2020-10-09 DIAGNOSIS — Z23 Encounter for immunization: Secondary | ICD-10-CM | POA: Diagnosis not present

## 2020-10-09 DIAGNOSIS — Z3480 Encounter for supervision of other normal pregnancy, unspecified trimester: Secondary | ICD-10-CM

## 2020-10-09 DIAGNOSIS — O99824 Streptococcus B carrier state complicating childbirth: Secondary | ICD-10-CM | POA: Diagnosis not present

## 2020-10-09 DIAGNOSIS — B009 Herpesviral infection, unspecified: Secondary | ICD-10-CM

## 2020-10-09 DIAGNOSIS — D649 Anemia, unspecified: Secondary | ICD-10-CM | POA: Diagnosis not present

## 2020-10-09 DIAGNOSIS — D62 Acute posthemorrhagic anemia: Secondary | ICD-10-CM | POA: Diagnosis not present

## 2020-10-09 DIAGNOSIS — A6000 Herpesviral infection of urogenital system, unspecified: Secondary | ICD-10-CM | POA: Diagnosis not present

## 2020-10-09 DIAGNOSIS — O99891 Other specified diseases and conditions complicating pregnancy: Secondary | ICD-10-CM | POA: Insufficient documentation

## 2020-10-09 DIAGNOSIS — O9081 Anemia of the puerperium: Secondary | ICD-10-CM | POA: Diagnosis not present

## 2020-10-09 DIAGNOSIS — O99013 Anemia complicating pregnancy, third trimester: Secondary | ICD-10-CM

## 2020-10-09 DIAGNOSIS — Z3A28 28 weeks gestation of pregnancy: Secondary | ICD-10-CM

## 2020-10-09 DIAGNOSIS — Z3A38 38 weeks gestation of pregnancy: Secondary | ICD-10-CM | POA: Diagnosis not present

## 2020-10-09 DIAGNOSIS — O9832 Other infections with a predominantly sexual mode of transmission complicating childbirth: Secondary | ICD-10-CM | POA: Diagnosis not present

## 2020-10-11 ENCOUNTER — Other Ambulatory Visit: Payer: Self-pay

## 2020-10-11 ENCOUNTER — Encounter (HOSPITAL_COMMUNITY): Payer: Self-pay | Admitting: Obstetrics and Gynecology

## 2020-10-11 ENCOUNTER — Inpatient Hospital Stay (HOSPITAL_COMMUNITY)
Admission: AD | Admit: 2020-10-11 | Discharge: 2020-10-14 | DRG: 806 | Disposition: A | Discharge status: Home / Self Care | Payer: BC Managed Care – PPO | Attending: Obstetrics and Gynecology | Admitting: Obstetrics and Gynecology

## 2020-10-11 DIAGNOSIS — O09899 Supervision of other high risk pregnancies, unspecified trimester: Secondary | ICD-10-CM

## 2020-10-11 DIAGNOSIS — O9832 Other infections with a predominantly sexual mode of transmission complicating childbirth: Secondary | ICD-10-CM | POA: Diagnosis present

## 2020-10-11 DIAGNOSIS — Z3A38 38 weeks gestation of pregnancy: Secondary | ICD-10-CM | POA: Diagnosis not present

## 2020-10-11 DIAGNOSIS — A6000 Herpesviral infection of urogenital system, unspecified: Secondary | ICD-10-CM | POA: Diagnosis present

## 2020-10-11 DIAGNOSIS — O26899 Other specified pregnancy related conditions, unspecified trimester: Secondary | ICD-10-CM

## 2020-10-11 DIAGNOSIS — D62 Acute posthemorrhagic anemia: Secondary | ICD-10-CM | POA: Diagnosis not present

## 2020-10-11 DIAGNOSIS — Z2839 Other underimmunization status: Secondary | ICD-10-CM

## 2020-10-11 DIAGNOSIS — Z23 Encounter for immunization: Secondary | ICD-10-CM | POA: Diagnosis not present

## 2020-10-11 DIAGNOSIS — O9081 Anemia of the puerperium: Secondary | ICD-10-CM | POA: Diagnosis not present

## 2020-10-11 DIAGNOSIS — Z6791 Unspecified blood type, Rh negative: Secondary | ICD-10-CM

## 2020-10-11 DIAGNOSIS — O99824 Streptococcus B carrier state complicating childbirth: Secondary | ICD-10-CM | POA: Diagnosis present

## 2020-10-11 DIAGNOSIS — B009 Herpesviral infection, unspecified: Secondary | ICD-10-CM | POA: Diagnosis present

## 2020-10-11 DIAGNOSIS — O43123 Velamentous insertion of umbilical cord, third trimester: Secondary | ICD-10-CM | POA: Diagnosis present

## 2020-10-11 DIAGNOSIS — Z20822 Contact with and (suspected) exposure to covid-19: Secondary | ICD-10-CM | POA: Diagnosis present

## 2020-10-11 DIAGNOSIS — B951 Streptococcus, group B, as the cause of diseases classified elsewhere: Secondary | ICD-10-CM | POA: Diagnosis present

## 2020-10-11 DIAGNOSIS — O26893 Other specified pregnancy related conditions, third trimester: Secondary | ICD-10-CM | POA: Diagnosis present

## 2020-10-11 LAB — CBC
HCT: 33 % — ABNORMAL LOW (ref 36.0–46.0)
Hemoglobin: 11.1 g/dL — ABNORMAL LOW (ref 12.0–15.0)
MCH: 29 pg (ref 26.0–34.0)
MCHC: 33.6 g/dL (ref 30.0–36.0)
MCV: 86.2 fL (ref 80.0–100.0)
Platelets: 229 10*3/uL (ref 150–400)
RBC: 3.83 MIL/uL — ABNORMAL LOW (ref 3.87–5.11)
RDW: 20.7 % — ABNORMAL HIGH (ref 11.5–15.5)
WBC: 7.4 10*3/uL (ref 4.0–10.5)
nRBC: 0 % (ref 0.0–0.2)

## 2020-10-11 LAB — RESP PANEL BY RT-PCR (FLU A&B, COVID) ARPGX2
Influenza A by PCR: NEGATIVE
Influenza B by PCR: NEGATIVE
SARS Coronavirus 2 by RT PCR: NEGATIVE

## 2020-10-11 LAB — POCT FERN TEST: POCT Fern Test: POSITIVE

## 2020-10-11 LAB — OB RESULTS CONSOLE GBS: GBS: POSITIVE

## 2020-10-11 MED ORDER — OXYTOCIN BOLUS FROM INFUSION
333.0000 mL | Freq: Once | INTRAVENOUS | Status: AC
  Administered 2020-10-12: 16:00:00 333 mL via INTRAVENOUS

## 2020-10-11 MED ORDER — SODIUM CHLORIDE 0.9 % IV SOLN
5.0000 10*6.[IU] | Freq: Once | INTRAVENOUS | Status: AC
  Administered 2020-10-11: 5 10*6.[IU] via INTRAVENOUS
  Filled 2020-10-11: qty 5

## 2020-10-11 MED ORDER — SOD CITRATE-CITRIC ACID 500-334 MG/5ML PO SOLN: 30.0000 mL | ORAL | Status: DC | PRN

## 2020-10-11 MED ORDER — OXYTOCIN-SODIUM CHLORIDE 30-0.9 UT/500ML-% IV SOLN: 2.5000 [IU]/h | INTRAVENOUS | Status: DC

## 2020-10-11 MED ORDER — LIDOCAINE HCL (PF) 1 % IJ SOLN
30.0000 mL | INTRAMUSCULAR | Status: AC | PRN
  Administered 2020-10-12: 30 mL via SUBCUTANEOUS
  Filled 2020-10-11: qty 30

## 2020-10-11 MED ORDER — ACETAMINOPHEN 325 MG PO TABS: 650.0000 mg | ORAL_TABLET | ORAL | Status: DC | PRN

## 2020-10-11 MED ORDER — OXYTOCIN-SODIUM CHLORIDE 30-0.9 UT/500ML-% IV SOLN
1.0000 m[IU]/min | INTRAVENOUS | Status: DC
  Administered 2020-10-11: 23:00:00 2 m[IU]/min via INTRAVENOUS
  Filled 2020-10-11 (×2): qty 500

## 2020-10-11 MED ORDER — ONDANSETRON HCL 4 MG/2ML IJ SOLN: 4.0000 mg | Freq: Four times a day (QID) | INTRAMUSCULAR | Status: DC | PRN

## 2020-10-11 MED ORDER — TERBUTALINE SULFATE 1 MG/ML IJ SOLN: 0.2500 mg | Freq: Once | INTRAMUSCULAR | Status: DC | PRN

## 2020-10-11 MED ORDER — LACTATED RINGERS IV SOLN
500.0000 mL | INTRAVENOUS | Status: DC | PRN
  Administered 2020-10-12: 14:00:00 500 mL via INTRAVENOUS

## 2020-10-11 MED ORDER — LACTATED RINGERS IV SOLN: INTRAVENOUS | Status: DC

## 2020-10-11 MED ORDER — FENTANYL CITRATE (PF) 100 MCG/2ML IJ SOLN: 50.0000 ug | INTRAMUSCULAR | Status: DC | PRN

## 2020-10-11 MED ORDER — PENICILLIN G POT IN DEXTROSE 60000 UNIT/ML IV SOLN
3.0000 10*6.[IU] | INTRAVENOUS | Status: DC
  Administered 2020-10-12 (×3): 3 10*6.[IU] via INTRAVENOUS
  Filled 2020-10-11 (×3): qty 50

## 2020-10-11 NOTE — H&P (Addendum)
OB ADMISSION/ HISTORY & PHYSICAL:  Admission Date: 10/11/2020  8:30 PM  Admit Diagnosis: Normal labor, SROM  Debbie Greene is a 34 y.o. female [id number] [redacted]w[redacted]d presenting for LOF since 1530. Endorses active FM, denies vaginal bleeding and regular or painful contractions.   History of current pregnancy: [id number]   Patient transferred care from [facility] and entered care with [facility] at 32+1 wks.   EDC by LMP and congruent w/ 10+2 wk U/S.        Significant prenatal events:  Patient Active Problem List   Diagnosis Date Noted  . Rh negative status during pregnancy 10/12/2020  . Normal labor 10/11/2020  . Group beta Strep positive 10/11/2020  . Rubella non-immune status, antepartum 06/19/2020  . Supervision of other normal pregnancy, antepartum 04/08/2020  . History of epistaxis 12/15/2018  . HSV (herpes simplex virus) infection 12/15/2018  . 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    Prenatal Labs: ABO, Rh: O/Negative/-- (07/02 0944) Antibody: Negative (07/02 0944) Rubella: Non-immune RPR: Non Reactive (07/02 0944)  HBsAg: Negative (07/02 0944)  HIV: Non Reactive (07/02 0944)  GTT: passed 3 hr GBS:   positive GC/CHL: neg/neg Genetics: low-risk female, Horizon neg Tdap/influenza vaccines: declined both   OB History  Gravida Para Term Preterm AB Living  6 1 1   4 1   SAB IAB Ectopic Multiple Live Births    4     1    # Outcome Date GA Lbr Len/2nd Weight Sex Delivery Anes PTL Lv  6 Current           5 Term 06/04/08 [redacted]w[redacted]d  3232 g M Vag-Spont EPI  LIV  4 IAB           3 IAB           2 IAB           1 IAB             Medical / Surgical History: Past medical history:  Past Medical History:  Diagnosis Date  . Herpes   . High risk sexual behavior 10/01/2016  . Tuberculosis     Past surgical history:  Past Surgical History:  Procedure Laterality Date  . THERAPEUTIC ABORTION     Pt states that she has had 2 abortions.    Family History:  Family History  Problem Relation Age of  Onset  . Cancer Maternal Grandmother        Breast  . Asthma Mother   . Hypertension Mother     Social History:  reports that she has never smoked. She has never used smokeless tobacco. She reports previous alcohol use. She reports that she does not use drugs.  Allergies: Patient has no known allergies.   Current Medications at time of admission:  Prior to Admission medications   Medication Sig Start Date End Date Taking? Authorizing Provider  ferrous sulfate 325 (65 FE) MG tablet Take 1 tablet (325 mg total) by mouth every other day. 07/30/20  Yes Leftwich-Kirby, 08/01/20, CNM  Prenat w/o A Vit-FeFum-FePo-FA (CONCEPT OB) 130-92.4-1 MG CAPS Take 1 capsule by mouth daily. 07/30/20  Yes Leftwich-Kirby, 08/01/20, CNM  valACYclovir (VALTREX) 1000 MG tablet Take 1 tablet (1,000 mg total) by mouth daily. 07/23/20  Yes 09/22/20, MD    Review of Systems: Constitutional: Negative   HENT: Negative   Eyes: Negative   Respiratory: Negative   Cardiovascular: Negative   Gastrointestinal: Negative  Genitourinary: neg for bloody show, pos for LOF   Musculoskeletal:  Negative   Skin: Negative   Neurological: Negative   Endo/Heme/Allergies: Negative   Psychiatric/Behavioral: Negative    Physical Exam: VS: Blood pressure 113/79, pulse 80, temperature 98.4 F (36.9 C), temperature source Oral, resp. rate 17, weight 88.5 kg, last menstrual period 01/15/2020. AAO x3, no signs of distress Cardiovascular: RRR Respiratory: Lung fields clear to ausculation GU/GI: Abdomen gravid, non-tender, non-distended, active FM, vertex, EFW 7-8# per Leopold's Extremities: trace edema, negative for pain, tenderness, and cords  Cervical exam:Dilation: 1 Effacement (%): Thick Station: -3 Exam by:: [name], RN FHR: baseline rate 125 / variability moderate / accelerations present / absent decelerations TOCO: irreg   Prenatal Transfer Tool  Maternal Diabetes: No Genetic Screening: Normal Maternal  Ultrasounds/Referrals: Other:Velementous cord insertion Fetal Ultrasounds or other Referrals:  Referred to Materal Fetal Medicine for VCI Maternal Substance Abuse:  No Significant Maternal Medications:  None Significant Maternal Lab Results: Group B Strep positive    Assessment: 34 y.o. J1P9150 [redacted]w[redacted]d SROM without labor HSV    -neg spec exam, no prodromal S/s    -suppression since 32 wks Rh neg    -rhogam at 30 wks Rubella non-immune FHR category 1 GBS pos  Pain management plan: unmedicated   Plan:  Admit to L&D Routine admission orders Epidural PRN PCN for GBS prophylaxis Dr Normand Sloop notified of admission and plan of care  [name] MSN, CNM 10/11/2020 9:50 PM

## 2020-10-11 NOTE — MAU Note (Signed)
Patient reports she started leaking clear fluid since 1530 w/ mucous in it.  CTX started throughout the day but hasn't timed them.  Denies any VB.  + FM.  Denies any complications w/ her pregnancy.  Closed on last exam 2 weeks ago.

## 2020-10-12 ENCOUNTER — Encounter (HOSPITAL_COMMUNITY): Payer: Self-pay | Admitting: Obstetrics and Gynecology

## 2020-10-12 DIAGNOSIS — Z6791 Unspecified blood type, Rh negative: Secondary | ICD-10-CM

## 2020-10-12 LAB — TYPE AND SCREEN
ABO/RH(D): O NEG
Antibody Screen: POSITIVE

## 2020-10-12 LAB — RPR: RPR Ser Ql: NONREACTIVE

## 2020-10-12 MED ORDER — BENZOCAINE-MENTHOL 20-0.5 % EX AERO
1.0000 "application " | INHALATION_SPRAY | CUTANEOUS | Status: DC | PRN
  Administered 2020-10-13: 1 via TOPICAL
  Filled 2020-10-12: qty 56

## 2020-10-12 MED ORDER — ONDANSETRON HCL 4 MG/2ML IJ SOLN: 4.0000 mg | INTRAMUSCULAR | Status: DC | PRN

## 2020-10-12 MED ORDER — SENNOSIDES-DOCUSATE SODIUM 8.6-50 MG PO TABS
2.0000 | ORAL_TABLET | Freq: Every day | ORAL | Status: DC
  Administered 2020-10-13 – 2020-10-14 (×2): 2 via ORAL
  Filled 2020-10-12 (×2): qty 2

## 2020-10-12 MED ORDER — IBUPROFEN 600 MG PO TABS
600.0000 mg | ORAL_TABLET | Freq: Four times a day (QID) | ORAL | Status: DC
  Administered 2020-10-12 – 2020-10-14 (×8): 600 mg via ORAL
  Filled 2020-10-12 (×8): qty 1

## 2020-10-12 MED ORDER — ZOLPIDEM TARTRATE 5 MG PO TABS: 5.0000 mg | ORAL_TABLET | Freq: Every evening | ORAL | Status: DC | PRN

## 2020-10-12 MED ORDER — WITCH HAZEL-GLYCERIN EX PADS: 1.0000 "application " | MEDICATED_PAD | CUTANEOUS | Status: DC | PRN

## 2020-10-12 MED ORDER — COCONUT OIL OIL: 1.0000 "application " | TOPICAL_OIL | Status: DC | PRN

## 2020-10-12 MED ORDER — SIMETHICONE 80 MG PO CHEW
80.0000 mg | CHEWABLE_TABLET | ORAL | Status: DC | PRN
Start: 2020-10-12 — End: 2020-10-14

## 2020-10-12 MED ORDER — DIBUCAINE (PERIANAL) 1 % EX OINT: 1.0000 "application " | TOPICAL_OINTMENT | CUTANEOUS | Status: DC | PRN

## 2020-10-12 MED ORDER — METHYLERGONOVINE MALEATE 0.2 MG/ML IJ SOLN
INTRAMUSCULAR | Status: AC
  Filled 2020-10-12: qty 1

## 2020-10-12 MED ORDER — ONDANSETRON HCL 4 MG PO TABS: 4.0000 mg | ORAL_TABLET | ORAL | Status: DC | PRN

## 2020-10-12 MED ORDER — ACETAMINOPHEN 325 MG PO TABS
650.0000 mg | ORAL_TABLET | ORAL | Status: DC | PRN
  Administered 2020-10-12 – 2020-10-14 (×4): 650 mg via ORAL
  Filled 2020-10-12 (×4): qty 2

## 2020-10-12 MED ORDER — PRENATAL MULTIVITAMIN CH
1.0000 | ORAL_TABLET | Freq: Every day | ORAL | Status: DC
  Administered 2020-10-13 – 2020-10-14 (×2): 1 via ORAL
  Filled 2020-10-12 (×2): qty 1

## 2020-10-12 MED ORDER — TETANUS-DIPHTH-ACELL PERTUSSIS 5-2.5-18.5 LF-MCG/0.5 IM SUSY: 0.5000 mL | PREFILLED_SYRINGE | Freq: Once | INTRAMUSCULAR | Status: DC

## 2020-10-12 MED ORDER — METHYLERGONOVINE MALEATE 0.2 MG/ML IJ SOLN
0.2000 mg | Freq: Once | INTRAMUSCULAR | Status: AC
  Administered 2020-10-12: 16:00:00 0.2 mg via INTRAMUSCULAR

## 2020-10-12 MED ORDER — DIPHENHYDRAMINE HCL 25 MG PO CAPS: 25.0000 mg | ORAL_CAPSULE | Freq: Four times a day (QID) | ORAL | Status: DC | PRN

## 2020-10-12 NOTE — Progress Notes (Signed)
Subjective: Pt on ball.  Still comfortable.  Breathing with some contractions.  Unable to monitor contractions with external toco  Objective: BP 112/67   Pulse 73   Temp 98 F (36.7 C) (Oral)   Resp 17   Ht 5\' 7"  (1.702 m)   Wt 88.5 kg   LMP 01/15/2020   BMI 30.56 kg/m  I/O last 3 completed shifts: In: 283.7 [I.V.:283.7] Out: -  No intake/output data recorded.  FHT: Category 1 FHT 130 variability presents accels noted UC:   regular, every 3-5 minutes IUPC placed after discussing procedure with patient.  Resting tone 20 mm hg  SVE:   Dilation: 4.5 Effacement (%): 70 Station: -1 Exam by:: [email or web address] Pitocin at 22 mu MVUs 230 EFW 6# Assessment:  34 yo G6P1041 at 38.5 IUP with SROM clear fluid x 19 hours ago afebrile Cat 1 strip GBS positive treated  Plan: Monitor contractions Limit SVEs Pt desires NCB Anticipate SVD  34 CNM, MSN 10/12/2020, 11:12 AM

## 2020-10-12 NOTE — Lactation Note (Signed)
This note was copied from a baby's chart. Lactation Consultation Note  Patient Name: Debbie Greene Debbie Greene Date: 10/12/2020   Age:32 hours  Spoke with RN Deretha Emory who was in moms room when [facility].  RN reports they just got the baby latched and mom reports she is doing fine and will call if she needs LC services later.              [name] [name] 10/12/2020, 4:33 PM

## 2020-10-12 NOTE — Progress Notes (Signed)
Subjective: Breathing with contractions.  Supportive partner in room  Objective: BP 104/63   Pulse 95   Temp 98.4 F (36.9 C) (Oral)   Resp 18   Ht 5\' 7"  (1.702 m)   Wt 88.5 kg   LMP 01/15/2020   BMI 30.56 kg/m  I/O last 3 completed shifts: In: 283.7 [I.V.:283.7] Out: -  No intake/output data recorded.  FHT: Category 1 FHJT 135 variability present  accels noted, no decels UC:   regular, every 2-4 minutes SVE:   Dilation: 9 Effacement (%): 70 Station: -1 Exam by:: Christyanna Mckeon CNM Pitocin at 22 mu IUPC out  Assessment:  Assessment:  34 yo G6P1041 at 38.5 IUP with SROM clear fluid x 24 hours ago afebrile Cat 1 strip GBS positive treated  Plan: Anticipate SVD  34 CNM, MSN 10/12/2020, 3:30 PM

## 2020-10-12 NOTE — Lactation Note (Signed)
This note was copied from a baby's chart. 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 Mom declined LC services at first but decided to ask for [facility] assistance with latching infant at the breast. Per mom, she BF her 1st child for 4 months who is now 68 years old. Mom is active on the [facility] program in [facility] although she did not attend any BF class in her pregnancy she did sign up for the [facility] BF Peer Counselor Program and BF Peer Counselor will follow her and baby for 1 month.  Per mom, she has DEBP at home. Per mom, infant had 2 voids and 2 stools since birth. LC discussed infant's input and output. LC observed mom has psuedo-inverted nipples that responds well to breast stimulation and easily everts outward when  stimulated with hand expression prior to latching infant at the breast.  Infant latched for 5 minutes in L&D. 5- minutes in MBU and this infant's third time latching at the breast. LC ask mom express small amount of colostrum out prior to latching infant at the breast, infant latched on mom's right breast using the football hold position, infant latched with depth and sustained latch for 10 minutes. LC discussed hand expression and mom taught back easily expressing 2 mls of colostrum that was given to infant by spoon. Infant appeared content after the feeding and mom was doing STS with infant when [facility] left the room. Mom knows to BF infant by hunger cues, 8 to 12+ times within 24 hours, STS. Mom knows to call RN or LC if she has any questions, concerns or needs assistance with latching infant at the breast.  Mom made aware of O/P services, breastfeeding support groups, community resources, and our phone # for post-discharge questions.  Maternal Data Formula Feeding for Exclusion: Yes Reason for exclusion:  Mother's choice to formula and breast feed on admission Has patient been taught Hand Expression?: Yes Does the patient have breastfeeding experience prior to this delivery?: Yes  Feeding Feeding Type: Breast Fed  LATCH Score Latch: Grasps breast easily, tongue down, lips flanged, rhythmical sucking.  Audible Swallowing: Spontaneous and intermittent  Type of Nipple: Inverted  Comfort (Breast/Nipple): Soft / non-tender  Hold (Positioning): Assistance needed to correctly position infant at breast and maintain latch.  LATCH Score: 7  Interventions Interventions: Breast feeding basics reviewed;Assisted with latch;Skin to skin;Adjust position;Breast compression;Support pillows;Breast massage;Hand express;Position options;Expressed milk  Lactation Tools Discussed/Used WIC Program: Yes   Consult Status Consult Status: Follow-up Date: 10/13/20 Follow-up type: In-patient    [name] 10/12/2020, 11:01 PM

## 2020-10-12 NOTE — Progress Notes (Signed)
Subjective:    Discussed plan of care to continue Pitocin and pt agrees. Pt ambulating for comfort. Plans unmedicated labor and birth.   Objective:    VS: BP 112/68   Pulse 73   Temp 97.9 F (36.6 C) (Oral)   Resp 17   Ht 5\' 7"  (1.702 m)   Wt 88.5 kg   LMP 01/15/2020   BMI 30.56 kg/m  FHR : baseline 125 / variability moderate / accelerations present / absent decelerations Toco: contractions every 2-3 minutes  SROM since 1530 on 12/25 Membranes: SROM since 1530 on 12/25 Dilation: 3 Effacement (%): 70 Cervical Position: Posterior Station: -1 Presentation: Vertex Exam by:: [email or web address], CNM Pitocin 18 mU/min  Assessment/Plan:   34 y.o. 34 [redacted]w[redacted]d  Labor: Progressing on Pitocin Preeclampsia:  no signs or symptoms of toxicity Fetal Wellbeing:  Category I Pain Control:  unmedicated I/D:  GBS pos Anticipated MOD:  NSVD  [redacted]w[redacted]d MSN, CNM 10/12/2020 6:49 AM

## 2020-10-13 LAB — CBC
HCT: 28.1 % — ABNORMAL LOW (ref 36.0–46.0)
Hemoglobin: 9.1 g/dL — ABNORMAL LOW (ref 12.0–15.0)
MCH: 27.7 pg (ref 26.0–34.0)
MCHC: 32.4 g/dL (ref 30.0–36.0)
MCV: 85.7 fL (ref 80.0–100.0)
Platelets: 205 10*3/uL (ref 150–400)
RBC: 3.28 MIL/uL — ABNORMAL LOW (ref 3.87–5.11)
RDW: 20.4 % — ABNORMAL HIGH (ref 11.5–15.5)
WBC: 13.5 10*3/uL — ABNORMAL HIGH (ref 4.0–10.5)
nRBC: 0 % (ref 0.0–0.2)

## 2020-10-13 MED ORDER — WITCH HAZEL-GLYCERIN EX PADS
1.0000 "application " | MEDICATED_PAD | Freq: Four times a day (QID) | CUTANEOUS | Status: DC
  Administered 2020-10-13 (×2): 1 via TOPICAL

## 2020-10-13 MED ORDER — RHO D IMMUNE GLOBULIN 1500 UNIT/2ML IJ SOSY
300.0000 ug | PREFILLED_SYRINGE | Freq: Once | INTRAMUSCULAR | Status: AC
  Administered 2020-10-13: 09:00:00 300 ug via INTRAVENOUS
  Filled 2020-10-13: qty 2

## 2020-10-13 MED ORDER — DIBUCAINE (PERIANAL) 1 % EX OINT
1.0000 "application " | TOPICAL_OINTMENT | CUTANEOUS | Status: DC | PRN
  Administered 2020-10-13: 1 via RECTAL
  Filled 2020-10-13: qty 28

## 2020-10-13 MED ORDER — BENZOCAINE-MENTHOL 20-0.5 % EX AERO
1.0000 "application " | INHALATION_SPRAY | Freq: Four times a day (QID) | CUTANEOUS | Status: DC
  Administered 2020-10-13: 1 via TOPICAL

## 2020-10-13 MED ORDER — MEASLES, MUMPS & RUBELLA VAC IJ SOLR
0.5000 mL | Freq: Once | INTRAMUSCULAR | Status: AC
  Administered 2020-10-14: 11:00:00 0.5 mL via SUBCUTANEOUS
  Filled 2020-10-13: qty 0.5

## 2020-10-13 NOTE — Progress Notes (Addendum)
PPD# 1 SVD w/ 2nd degree Information for the patient's newborn:  Debbie Greene, Debbie Greene [[id number]]  female    Baby Name Greig Right Circumcision desires in-pt   S:   Reports feeling good but "bottom is sore" Tolerating PO fluid and solids No nausea or vomiting Bleeding is light, no clots Pain controlled with PO meds Up ad lib / ambulatory / voiding w/o difficulty Feeding: Breast    O:   VS: BP 107/68 (BP Location: Left Arm)   Pulse 67   Temp 97.7 F (36.5 C) (Oral)   Resp 17   Ht 5\' 7"  (1.702 m)   Wt 88.5 kg   LMP 01/15/2020   SpO2 100%   Breastfeeding Unknown   BMI 30.56 kg/m   LABS:  Recent Labs    10/11/20 2118 10/13/20 0402  WBC 7.4 13.5*  HGB 11.1* 9.1*  PLT 229 205   Blood type: --/--/O NEG (12/27 0402) Rubella: Nonimmune (11/16 0000)                      I&O: Intake/Output      12/26 0701 12/27 0700 12/27 0701 12/28 0700   I.V. (mL/kg) 348.3 (3.9)    Total Intake(mL/kg) 348.3 (3.9)    Blood 150    Total Output 150    Net +198.3           Physical Exam: Alert and oriented X3 Lungs: Clear and unlabored Heart: regular rate and rhythm / no mumurs Abdomen: soft, non-tender, non-distended  Fundus: firm, non-tender Perineum: well-approximated, non-edematous Lochia: appropriate Extremities: no edema, no calf pain or tenderness    A:  PPD # 1  Normal exam Rh neg Rubella non-immune  P:  Routine post partum orders Rhogam dose completed Offer Rubella vaccine prior to discharge PRN pain meds changed to scheduled meds for perineal discomfort Anticipate D/C on 10/14/20  Plan reviewed w/ Dr. 10/16/20, MSN, CNM 10/13/2020, 10:34 AM

## 2020-10-13 NOTE — Lactation Note (Signed)
This note was copied from a baby's chart. Lactation Consultation Note  Patient Name: Debbie Greene YSHUO'H Date: 10/13/2020 Reason for consult: Follow-up assessment Age:32 hours   LC student completed a follow up visit with LC Debbie Greene. Infant was being transported for circumcision. Mom stated she has not been able to get infant to feed because he is so sleepy. She stated she has been hand expressing into a spoon and feeding him. [facility] student educated mother on how to use a manual pump and how to properly store milk. Mother was provided extra spoons and colostrum containers. Mother was informed that infant may be sleepy after circumcision and was advised to hand express or pump. Mother stated she did not have any further questions.   Maternal Data Formula Feeding for Exclusion: No Has patient been taught Hand Expression?: Yes  Feeding Feeding Type: Breast Milk   Interventions Interventions: Breast feeding basics reviewed;Hand express;Hand pump  Lactation Tools Discussed/Used Tools: Pump Breast pump type: Manual Pump Education: Setup, frequency, and cleaning;Milk Storage   Consult Status Consult Status: Follow-up Date: 10/14/20 Follow-up type: In-patient    [facility] Student -[employer]ion Smith 10/13/2020, 6:30 PM   Freman Lapage A Higuera Ancidey IBCLC 10/13/2020, 6:37 PM

## 2020-10-14 DIAGNOSIS — D62 Acute posthemorrhagic anemia: Secondary | ICD-10-CM | POA: Diagnosis not present

## 2020-10-14 LAB — RH IG WORKUP (INCLUDES ABO/RH)
ABO/RH(D): O NEG
Fetal Screen: NEGATIVE
Gestational Age(Wks): 38.5
Unit division: 0

## 2020-10-14 MED ORDER — IBUPROFEN 600 MG PO TABS
600.0000 mg | ORAL_TABLET | Freq: Four times a day (QID) | ORAL | 0 refills | Status: AC
Start: 2020-10-14 — End: ?

## 2020-10-14 MED ORDER — POLYSACCHARIDE IRON COMPLEX 150 MG PO CAPS
150.0000 mg | ORAL_CAPSULE | Freq: Every day | ORAL | Status: DC
Start: 2020-10-14 — End: 2020-10-14
  Administered 2020-10-14: 11:00:00 150 mg via ORAL
  Filled 2020-10-14: qty 1

## 2020-10-14 MED ORDER — MAGNESIUM OXIDE 400 (241.3 MG) MG PO TABS
400.0000 mg | ORAL_TABLET | Freq: Every day | ORAL | Status: DC
  Administered 2020-10-14: 11:00:00 400 mg via ORAL
  Filled 2020-10-14: qty 1

## 2020-10-14 NOTE — Discharge Summary (Signed)
SVD OB Discharge Summary     Patient Name: Debbie Greene DOB: 1986-04-02 MRN: [id number]  Date of admission: 10/11/2020 Delivering MD: Kenney Houseman  Date of delivery: SVD Type of delivery: 10/12/2020  Newborn Data: Sex: Baby female Circumcision: done in pt Live born female  Birth Weight: 7 lb 5.5 oz (3331 g) APGAR: 9, 9  Newborn Delivery   Birth date/time: 10/12/2020 15:50:00 Delivery type: Vaginal, Spontaneous      Feeding: breast Infant being discharge to home with mother in stable condition.   Admitting diagnosis: Normal labor [O80, Z37.9] Intrauterine pregnancy: [redacted]w[redacted]d     Secondary diagnosis:  Active Problems:   HSV (herpes simplex virus) infection   Rubella non-immune status, antepartum   Normal labor   Group beta Strep positive   Rh negative status during pregnancy   SVD (12/26)   Acute blood loss anemia   Normal postpartum course                                Complications: None and ROM>24 hours                                                              Intrapartum Procedures: spontaneous vaginal delivery Postpartum Procedures: antibiotics, Rho(D) Ig and Rubella Ig Complications-Operative and Postpartum: 2nd degree perineal laceration Augmentation: Pitocin   History of Present Illness: [name] is a 34 y.o. female, N8G9562, who presents at [redacted]w[redacted]d weeks gestation. The patient has been followed at  [facility]  Her pregnancy has been complicated by:  Patient Active Problem List   Diagnosis Date Noted  . Acute blood loss anemia 10/14/2020  . Normal postpartum course 10/14/2020  . Rh negative status during pregnancy 10/12/2020  . SVD (12/26) 10/12/2020  . Normal labor 10/11/2020  . Group beta Strep positive 10/11/2020  . Rubella non-immune status, antepartum 06/19/2020  . Supervision of other normal pregnancy, antepartum 04/08/2020  . History of epistaxis 12/15/2018  . HSV (herpes simplex virus)  infection 12/15/2018  . 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 yo [id number] at [redacted]w[redacted]d was admitted in Latent Labor on 10/11/2020. Patient had an uncomplicated labor course as follows:  Membrane Rupture Time/Date: 3:30 PM ,10/11/2020   Delivery Method:Vaginal, Spontaneous  Episiotomy: None  Lacerations:  2nd degree;Perineal  Patient had an uncomplicated postpartum course.  She is ambulating, tolerating a regular diet, passing flatus, and urinating well. Patient is discharged home in stable condition on 10/14/20.  Newborn Data: Birth date:10/12/2020  Birth time:3:50 PM  Gender:Female  Living status:Living  Apgars:9 ,9  Weight:3331 g  Postpartum Day # 2 : S/P NSVD due to SROM, progressed with pitocin, over 2nd degree repaired laceration, ebl was , hgb drop from 11.1-9.1 started on iron today, asymptomatic. Patient up ad lib, denies syncope or dizziness. Reports consuming regular diet without issues and denies N/V. Patient reports 0 bowel movement + passing flatus.  Denies issues with urination and reports bleeding is "lighter."  Patient is breastfeeding and reports going well.  Desires undecided for postpartum contraception.  Pain is being appropriately managed with use of po meds.  Physical exam  Vitals:   10/13/20 0716 10/13/20 1322 10/13/20 2000 10/14/20 0500  BP: 107/68 98/63 130/81 113/76  Pulse: 67 68 92 69  Resp: 17 17 18 18   Temp: 97.7 F (36.5 C) 98 F (36.7 C)  98.1 F (36.7 C)  TempSrc: Oral Oral Oral Oral  SpO2:  100% 100% 100%  Weight:      Height:       General: alert, cooperative and no distress Lochia: appropriate Uterine Fundus: firm Perineum: approximate, no hematomas noted DVT Evaluation: No evidence of DVT seen on physical exam. Negative Homan's sign. No cords or calf tenderness. No significant calf/ankle edema.  Labs: Lab Results  Component Value Date   WBC 13.5 (H) 10/13/2020   HGB 9.1  (L) 10/13/2020   HCT 28.1 (L) 10/13/2020   MCV 85.7 10/13/2020   PLT 205 10/13/2020   CMP Latest Ref Rng & Units 07/30/2020  Glucose 70 - 99 mg/dL 91  BUN 6 - 20 mg/dL 6  Creatinine 08/01/2020 - 2.84 mg/dL 1.32  Sodium 4.40 - 102 mmol/L 136  Potassium 3.5 - 5.1 mmol/L 3.5  Chloride 98 - 111 mmol/L 106  CO2 22 - 32 mmol/L 22  Calcium 8.9 - 10.3 mg/dL 8.3(L)  Total Protein 6.5 - 8.1 g/dL 6.0(L)  Total Bilirubin 0.3 - 1.2 mg/dL 0.4  Alkaline Phos 38 - 126 U/L 67  AST 15 - 41 U/L 15  ALT 0 - 44 U/L 12    Date of discharge: 10/14/2020 Discharge Diagnoses: Term Pregnancy-delivered Discharge instruction: per After Visit Summary and "Baby and Me Booklet".  After visit meds:   Activity:           unrestricted and pelvic rest Advance as tolerated. Pelvic rest for 6 weeks.  Diet:                routine Medications: PNV, Ibuprofen, Colace and Iron Postpartum contraception: Undecided Condition:  Pt discharge to home with baby in stable and condition Anemia: PO Iron   Meds: Allergies as of 10/14/2020   No Known Allergies     Medication List    TAKE these medications   Concept OB 130-92.4-1 MG Caps Take 1 capsule by mouth daily.   ferrous sulfate 325 (65 FE) MG tablet Take 1 tablet (325 mg total) by mouth every other day.   ibuprofen 600 MG tablet Commonly known as: ADVIL Take 1 tablet (600 mg total) by mouth every 6 (six) hours.   valACYclovir 1000 MG tablet Commonly known as: VALTREX Take 1 tablet (1,000 mg total) by mouth daily.       Discharge Follow Up:   Follow-up Information    Medicine, [facility] On 10/15/2020.   Specialty: Family Medicine Why: appt is Weds at 8:30am Contact information: [address] [place] [phone]        [facility]. Schedule an appointment as soon as possible for a visit in 6 week(s).   Specialty: Obstetrics and Gynecology Contact information: [address] [place] [phone]               Boyd, NP-C, CNM 10/14/2020, 11:29 AM  10/16/2020, FNP

## 2020-10-16 ENCOUNTER — Ambulatory Visit: Payer: BC Managed Care – PPO

## 2020-10-16 DIAGNOSIS — Z03818 Encounter for observation for suspected exposure to other biological agents ruled out: Secondary | ICD-10-CM | POA: Diagnosis not present

## 2020-10-24 ENCOUNTER — Other Ambulatory Visit (HOSPITAL_COMMUNITY): Payer: Self-pay | Admitting: Obstetrics and Gynecology

## 2020-10-24 ENCOUNTER — Other Ambulatory Visit: Payer: Self-pay

## 2020-10-24 ENCOUNTER — Ambulatory Visit (HOSPITAL_COMMUNITY)
Admission: RE | Admit: 2020-10-24 | Discharge: 2020-10-24 | Disposition: A | Discharge status: Home / Self Care | Payer: BC Managed Care – PPO | Source: Ambulatory Visit | Attending: Cardiology | Admitting: Cardiology

## 2020-10-24 DIAGNOSIS — M7989 Other specified soft tissue disorders: Secondary | ICD-10-CM

## 2020-10-24 DIAGNOSIS — M79604 Pain in right leg: Secondary | ICD-10-CM

## 2020-11-24 DIAGNOSIS — N760 Acute vaginitis: Secondary | ICD-10-CM | POA: Diagnosis not present

## 2020-11-24 DIAGNOSIS — M25579 Pain in unspecified ankle and joints of unspecified foot: Secondary | ICD-10-CM | POA: Diagnosis not present

## 2020-11-24 DIAGNOSIS — Z309 Encounter for contraceptive management, unspecified: Secondary | ICD-10-CM | POA: Diagnosis not present

## 2020-12-17 DIAGNOSIS — O8612 Endometritis following delivery: Secondary | ICD-10-CM | POA: Diagnosis not present

## 2020-12-17 DIAGNOSIS — M545 Low back pain, unspecified: Secondary | ICD-10-CM | POA: Diagnosis not present

## 2020-12-17 DIAGNOSIS — Z113 Encounter for screening for infections with a predominantly sexual mode of transmission: Secondary | ICD-10-CM | POA: Diagnosis not present

## 2020-12-17 DIAGNOSIS — R102 Pelvic and perineal pain: Secondary | ICD-10-CM | POA: Diagnosis not present

## 2020-12-17 DIAGNOSIS — N915 Oligomenorrhea, unspecified: Secondary | ICD-10-CM | POA: Diagnosis not present

## 2020-12-18 DIAGNOSIS — R9389 Abnormal findings on diagnostic imaging of other specified body structures: Secondary | ICD-10-CM | POA: Diagnosis not present

## 2020-12-18 DIAGNOSIS — R102 Pelvic and perineal pain: Secondary | ICD-10-CM | POA: Diagnosis not present

## 2020-12-18 DIAGNOSIS — N914 Secondary oligomenorrhea: Secondary | ICD-10-CM | POA: Diagnosis not present

## 2021-01-05 DIAGNOSIS — R7989 Other specified abnormal findings of blood chemistry: Secondary | ICD-10-CM | POA: Diagnosis not present

## 2021-01-05 DIAGNOSIS — R2241 Localized swelling, mass and lump, right lower limb: Secondary | ICD-10-CM | POA: Diagnosis not present

## 2021-01-05 DIAGNOSIS — M79671 Pain in right foot: Secondary | ICD-10-CM | POA: Diagnosis not present

## 2021-01-05 DIAGNOSIS — R9389 Abnormal findings on diagnostic imaging of other specified body structures: Secondary | ICD-10-CM | POA: Diagnosis not present

## 2021-01-06 DIAGNOSIS — M545 Low back pain, unspecified: Secondary | ICD-10-CM | POA: Diagnosis not present

## 2021-01-06 DIAGNOSIS — M5432 Sciatica, left side: Secondary | ICD-10-CM | POA: Diagnosis not present

## 2021-01-06 DIAGNOSIS — R102 Pelvic and perineal pain: Secondary | ICD-10-CM | POA: Diagnosis not present

## 2021-01-06 DIAGNOSIS — N914 Secondary oligomenorrhea: Secondary | ICD-10-CM | POA: Diagnosis not present

## 2021-01-23 DIAGNOSIS — M5459 Other low back pain: Secondary | ICD-10-CM | POA: Diagnosis not present

## 2021-02-13 DIAGNOSIS — M545 Low back pain, unspecified: Secondary | ICD-10-CM | POA: Diagnosis not present

## 2021-03-27 DIAGNOSIS — M545 Low back pain, unspecified: Secondary | ICD-10-CM | POA: Diagnosis not present

## 2021-04-09 DIAGNOSIS — M545 Low back pain, unspecified: Secondary | ICD-10-CM | POA: Diagnosis not present

## 2021-04-17 DIAGNOSIS — M545 Low back pain, unspecified: Secondary | ICD-10-CM | POA: Diagnosis not present

## 2021-05-08 DIAGNOSIS — M545 Low back pain, unspecified: Secondary | ICD-10-CM | POA: Diagnosis not present

## 2021-05-15 DIAGNOSIS — R102 Pelvic and perineal pain: Secondary | ICD-10-CM | POA: Diagnosis not present

## 2021-05-15 DIAGNOSIS — Z01411 Encounter for gynecological examination (general) (routine) with abnormal findings: Secondary | ICD-10-CM | POA: Diagnosis not present

## 2021-05-15 DIAGNOSIS — Z124 Encounter for screening for malignant neoplasm of cervix: Secondary | ICD-10-CM | POA: Diagnosis not present

## 2021-05-15 DIAGNOSIS — M545 Low back pain, unspecified: Secondary | ICD-10-CM | POA: Diagnosis not present

## 2021-05-22 DIAGNOSIS — M545 Low back pain, unspecified: Secondary | ICD-10-CM | POA: Diagnosis not present

## 2021-05-31 IMAGING — US US MFM OB FOLLOW-UP
1 series · 14 of 28 positions shown · non-contrast
Comparison: none

[Series 1: us mfm ob follow-up · 68 acquisitions, 14 frames shown]
[im 3/68]
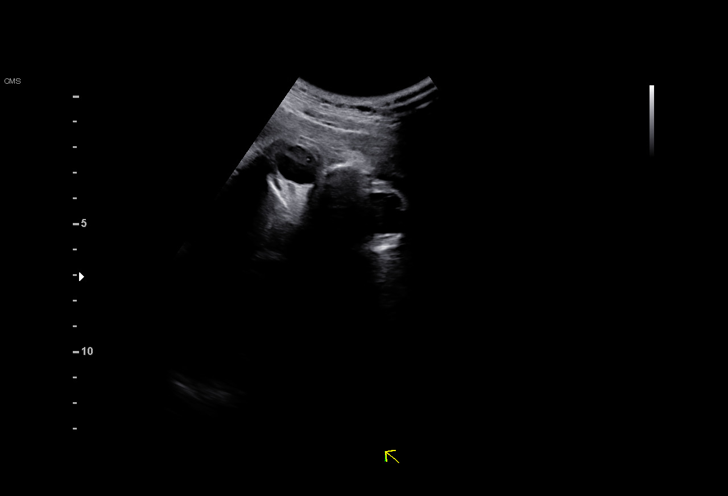
[im 8/68]
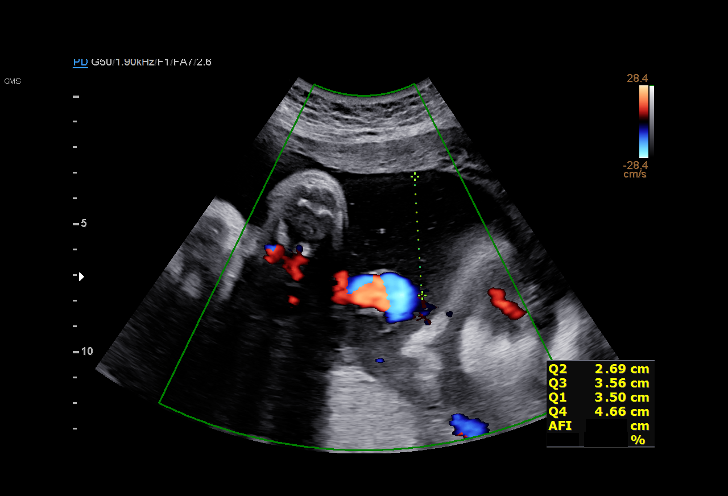
[im 13/68]
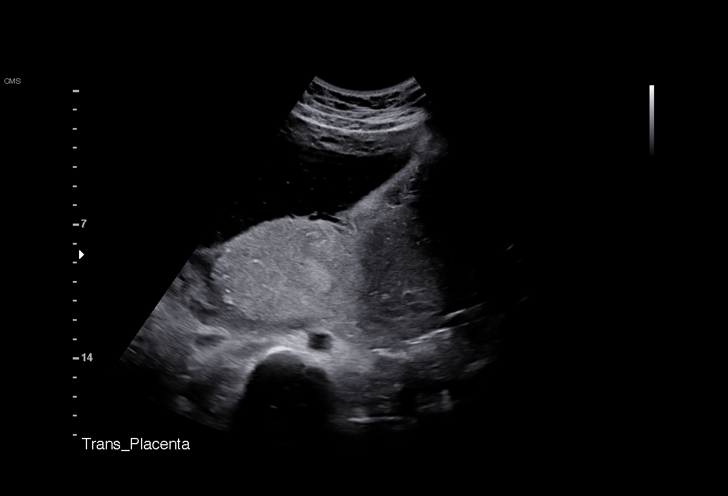
[im 18/68]
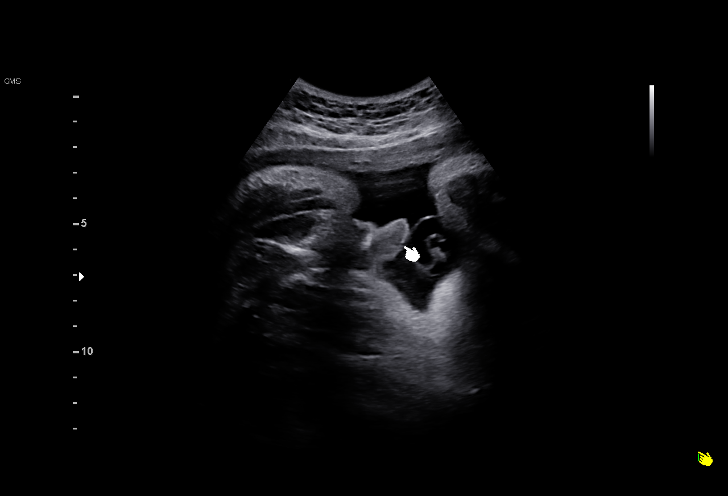
[im 23/68]
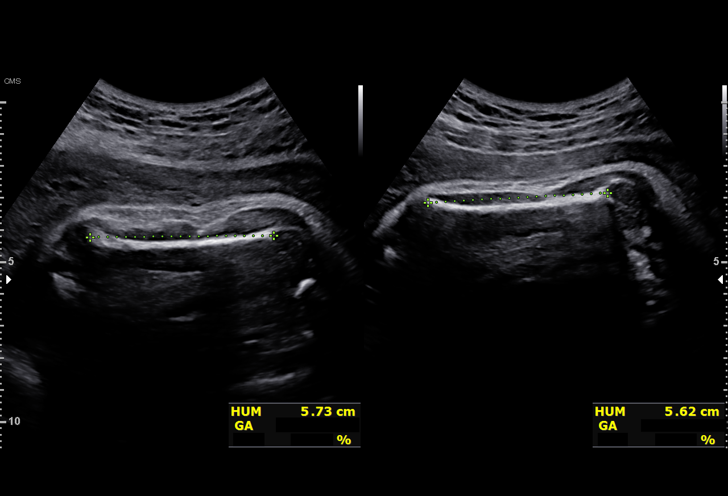
[im 28/68]
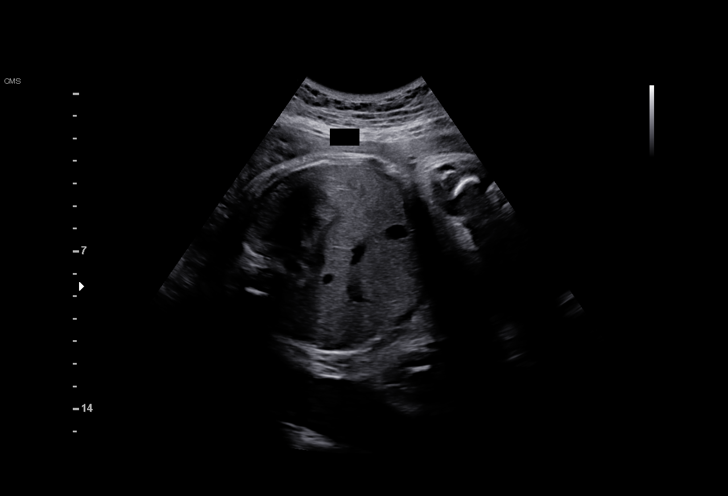
[im 33/68]
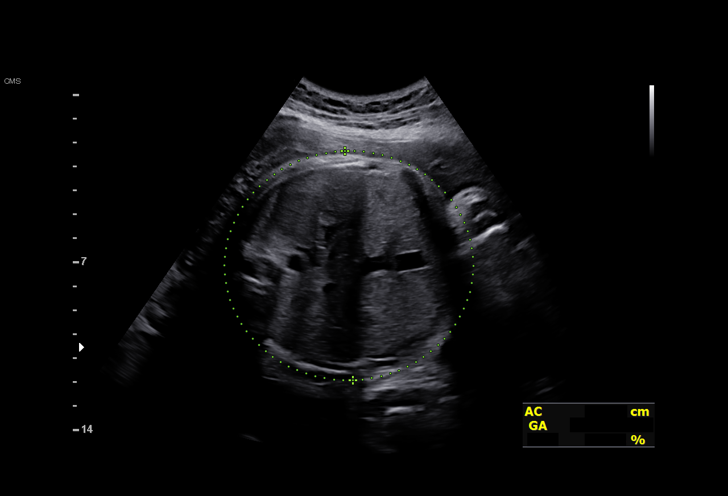
[im 38/68]
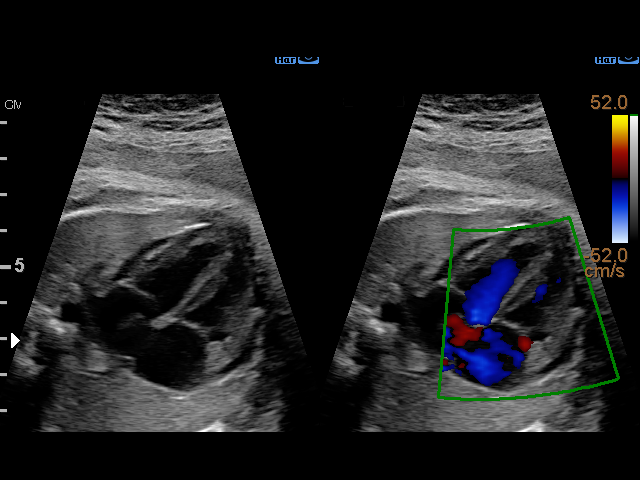
[im 43/68]
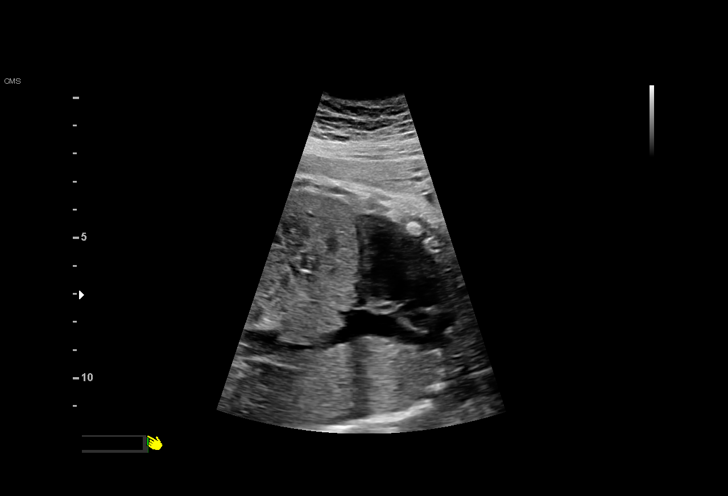
[im 48/68]
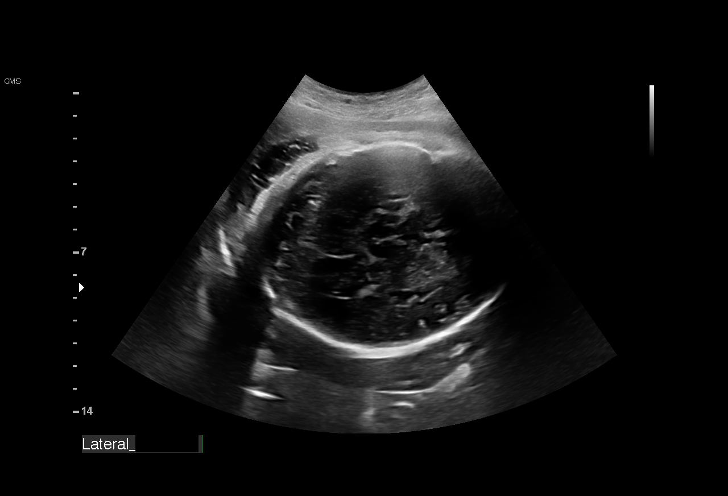
[im 53/68]
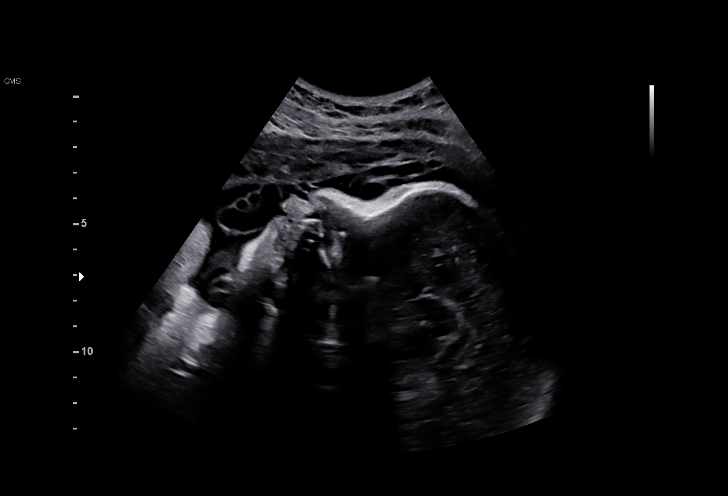
[im 58/68]
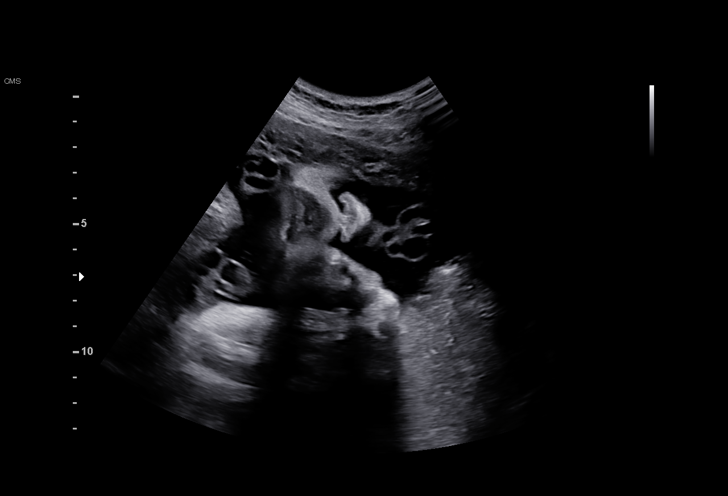
[im 63/68]
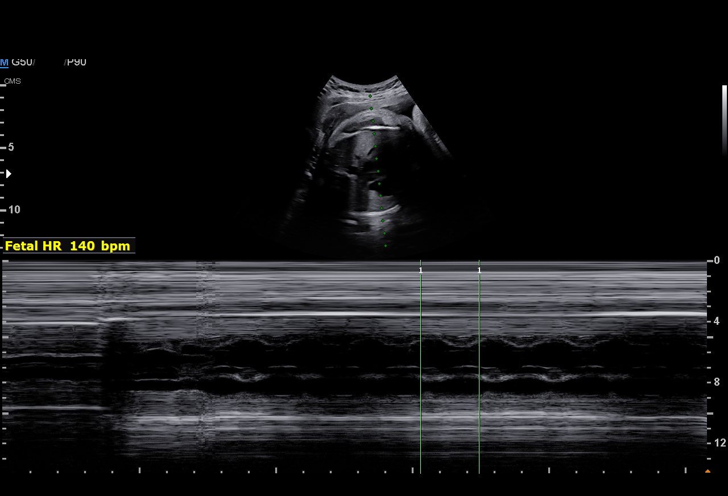
[im 68/68]
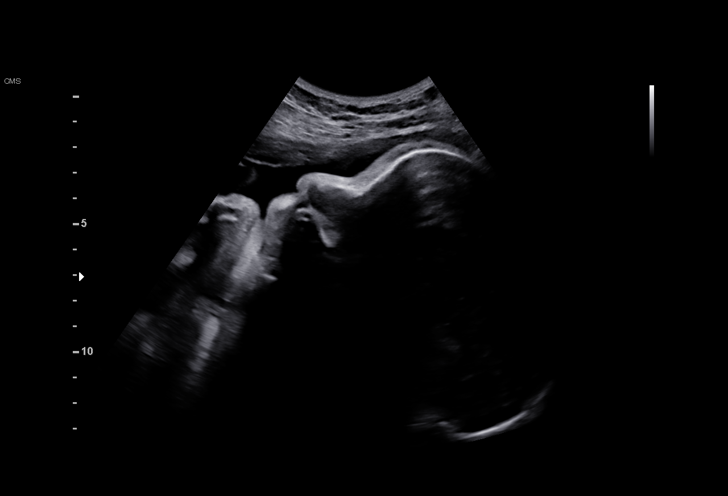

[14 of 28 positions shown; findings below may reference images not displayed]

Indications

 Velamentous insertion of umbilical cord
 35 weeks gestation of pregnancy
 Anemia during pregnancy in second trimester
 Herpes simplex virus (CHRISTELA)
 Encounter for other antenatal screening
 follow-up
Fetal Evaluation

 Num Of Fetuses:         1
 Fetal Heart Rate(bpm):  140
 Cardiac Activity:       Observed
 Presentation:           Cephalic
 Placenta:               Posterior
 P. Cord Insertion:      Velamentous insertion

 Amniotic Fluid
 AFI FV:      Within normal limits

 AFI Sum(cm)     %Tile       Largest Pocket(cm)
 14.5            52

 RUQ(cm)       RLQ(cm)       LUQ(cm)        LLQ(cm)

Biometry
 BPD:      90.5  mm     G. Age:  36w 5d         85  %    CI:         71.9   %    70 - 86
                                                         FL/HC:      19.7   %    20.1 -
 HC:      339.7  mm     G. Age:  39w 0d         93  %    HC/AC:      1.08        0.93 -
 AC:      313.6  mm     G. Age:  35w 2d         54  %    FL/BPD:     73.8   %    71 - 87
 FL:       66.8  mm     G. Age:  34w 3d         18  %    FL/AC:      21.3   %    20 - 24
 HUM:      56.4  mm     G. Age:  32w 6d         15  %
 LV:        1.8  mm

 Est. FW:    0045  gm           6 lb     53  %
OB History

 Gravidity:    6
 Living:       1
Gestational Age

 LMP:           35w 3d        Date:  01/15/20                 EDD:   10/21/20
 U/S Today:     36w 3d                                        EDD:   10/14/20
 Best:          35w 3d     Det. By:  Early Ultrasound         EDD:   10/21/20
Anatomy

 Cranium:               Appears normal         Aortic Arch:            Previously seen
 Cavum:                 Appears normal         Ductal Arch:            Previously seen
 Ventricles:            Appears normal         Diaphragm:              Appears normal
 Choroid Plexus:        Appears normal         Stomach:                Appears normal, left
                                                                       sided
 Cerebellum:            Previously seen        Abdomen:                Appears normal
 Posterior Fossa:       Previously seen        Abdominal Wall:         Previously seen
 Nuchal Fold:           Not applicable (>20    Cord Vessels:           Previously seen
                        wks GA)
 Face:                  Appears normal         Kidneys:                Appear normal
                        (orbits and profile)
 Lips:                  Appears normal         Bladder:                Appears normal
 Thoracic:              Appears normal         Spine:                  Previously seen
 Heart:                 Appears normal         Upper Extremities:      Previously seen
                        (4CH, axis, and
                        situs)
 RVOT:                  Appears normal         Lower Extremities:      Previously seen
 LVOT:                  Appears normal

 Other:  3VV and 3VTV visualized. IVC/SVC seen. Nasal bone visualized.
Cervix Uterus Adnexa

 Cervix
 Not visualized (advanced GA >35wks)

 Uterus
 No abnormality visualized.

 Right Ovary
 Not visualized.
 Left Ovary
 Within normal limits.

 Cul De Sac
 No free fluid seen.

 Adnexa
 No abnormality visualized.
Comments

 This patient was seen for a follow up growth scan due to a
 velamentous cord insertion that was noted during her prior
 exams.  She denies any problems since her last exam.
 She was informed that the fetal growth and amniotic fluid
 level appears appropriate for her gestational age.
 There were no signs of vasa previa noted today.
 Due to the velamentous cord insertion, we will continue to
 follow her with weekly fetal testing.
 A biophysical profile was scheduled in 1 week.

## 2021-06-07 IMAGING — US US MFM FETAL BPP W/O NON-STRESS
1 series · 14 of 23 positions shown · non-contrast
Comparison: none

[Series 1: us mfm fetal bpp w/o non-stress · 23 acquisitions, 14 frames shown]
[im 1/23]
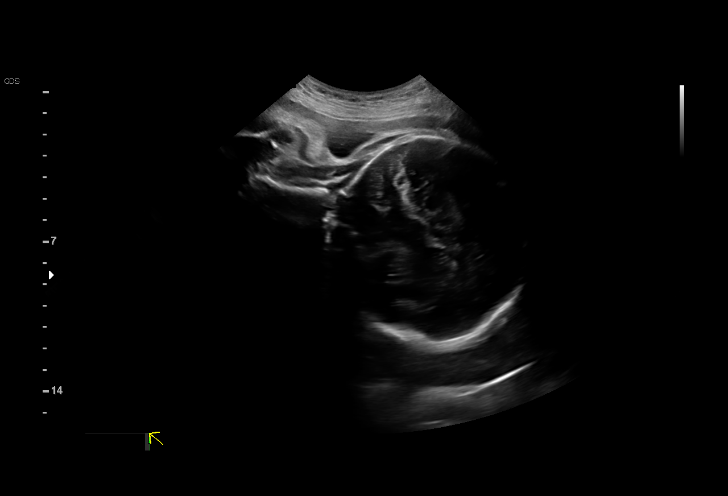
[im 3/23]
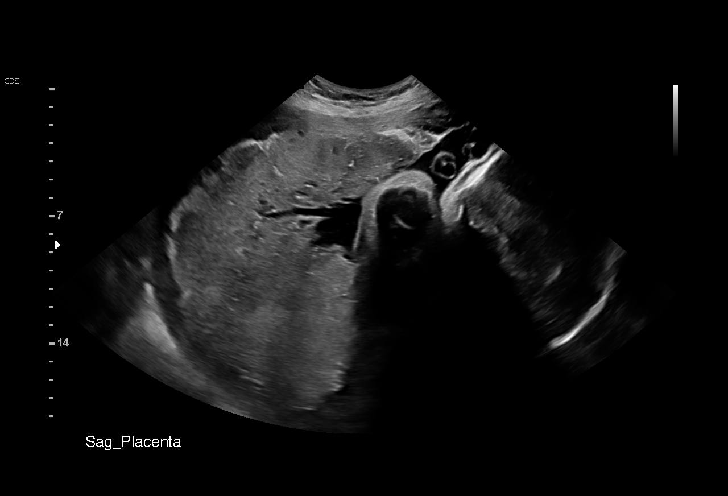
[im 5/23]
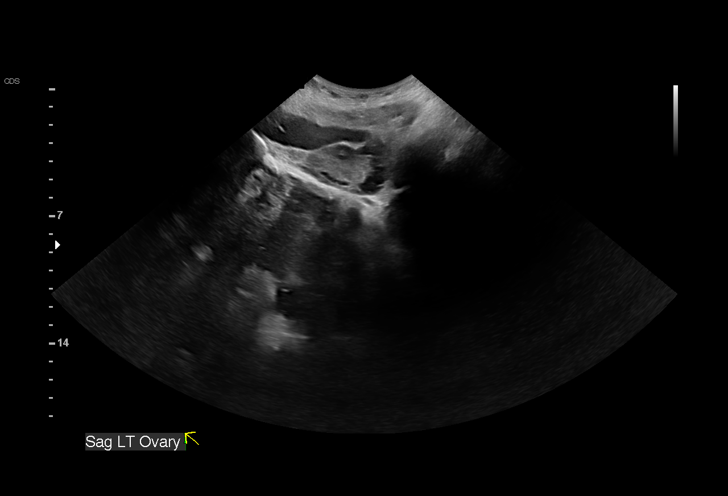
[im 6/23]
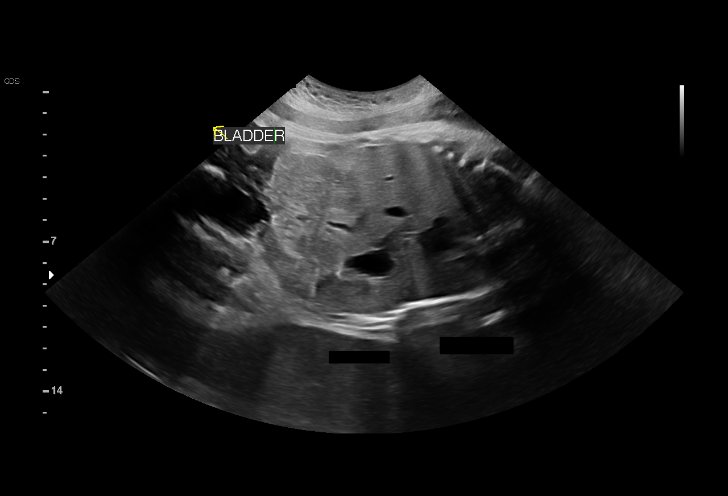
[im 8/23]
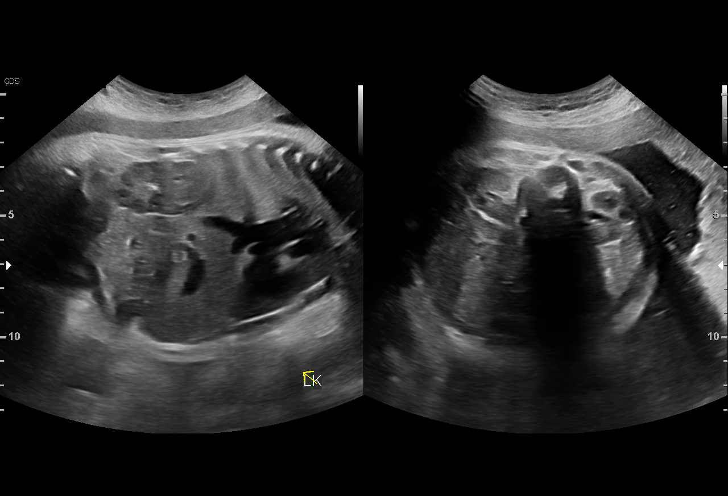
[im 10/23]
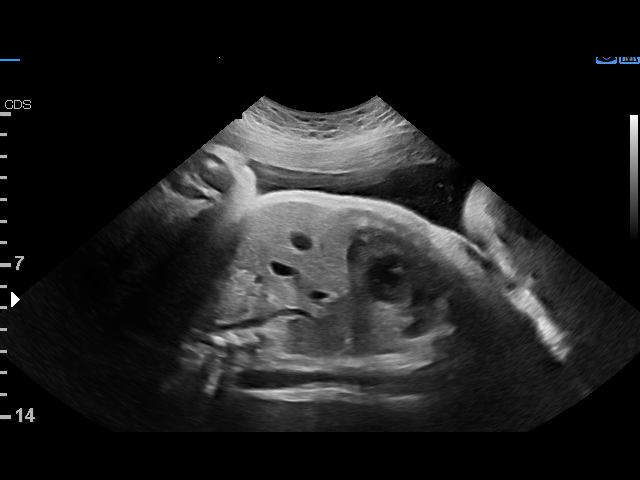
[im 11/23]
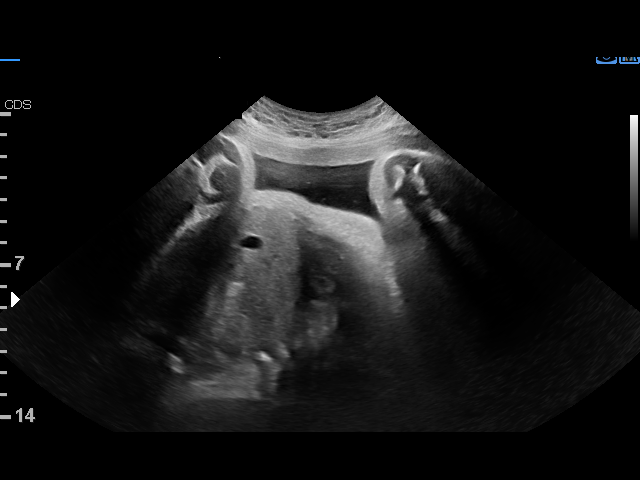
[im 13/23]
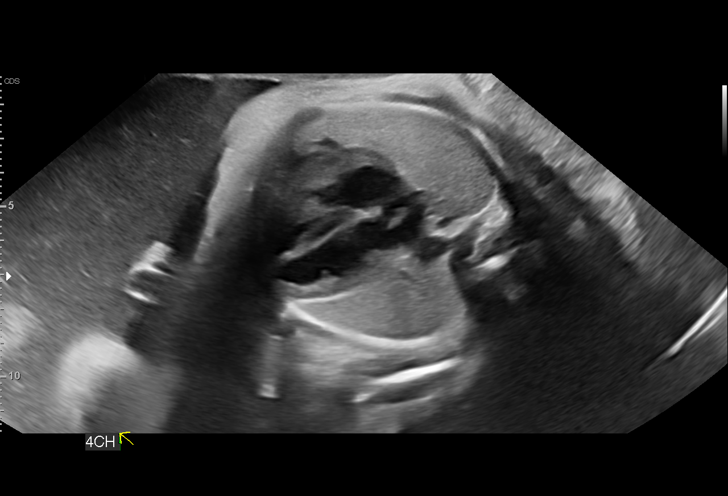
[im 14/23]
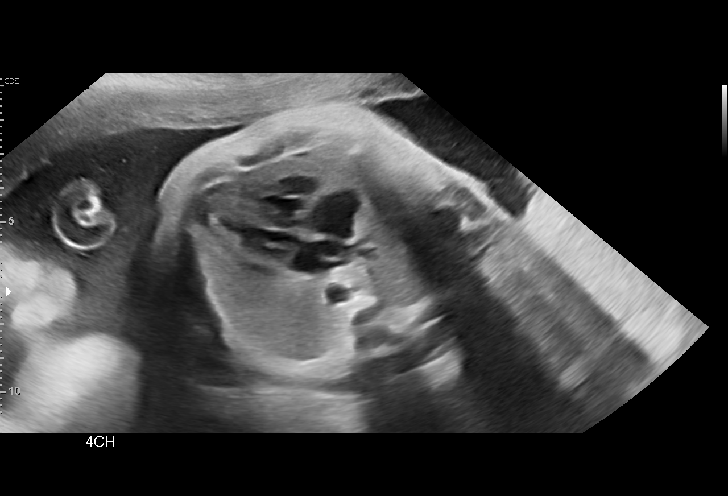
[im 16/23]
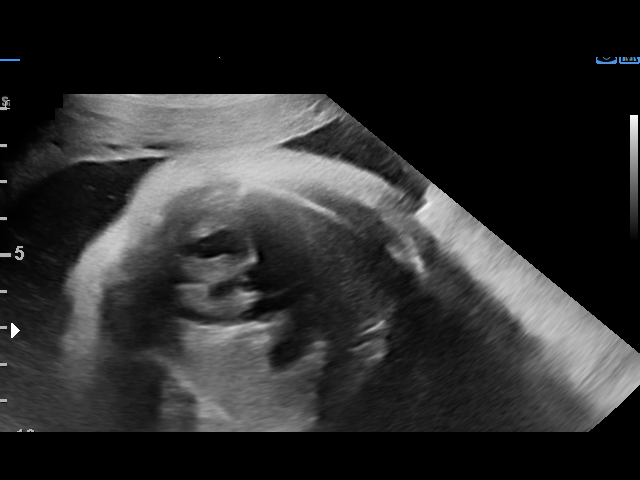
[im 18/23]
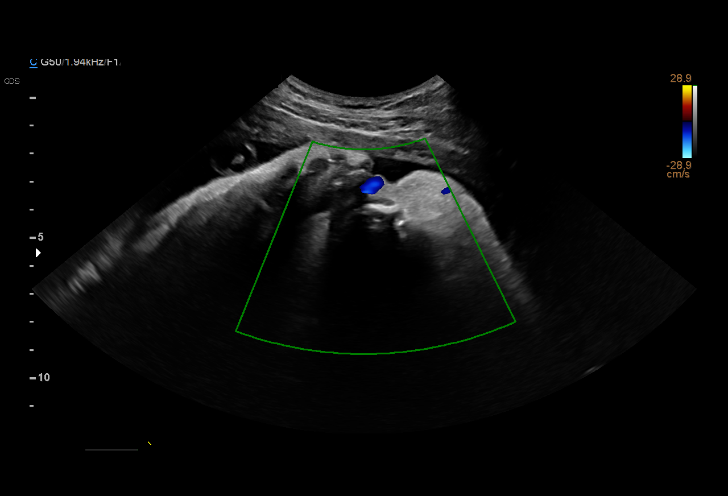
[im 19/23]
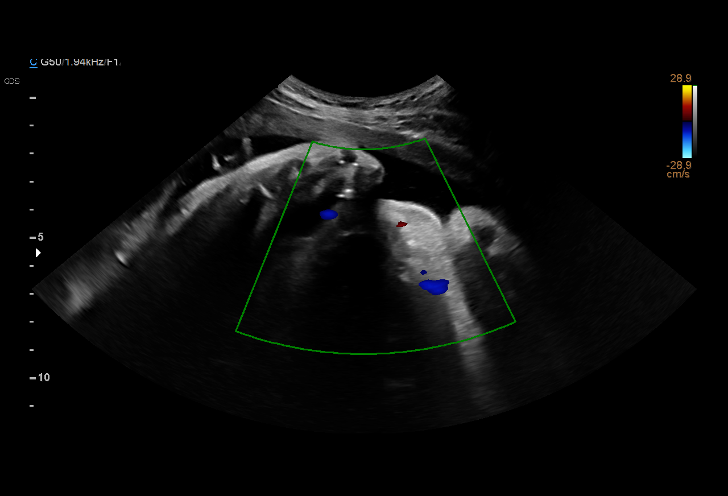
[im 21/23]
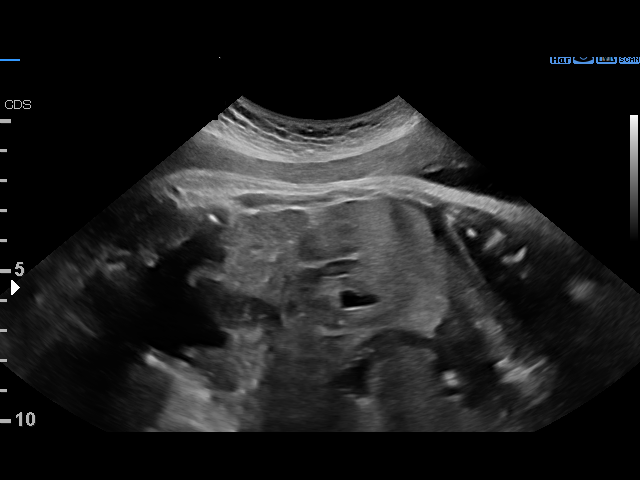
[im 23/23]
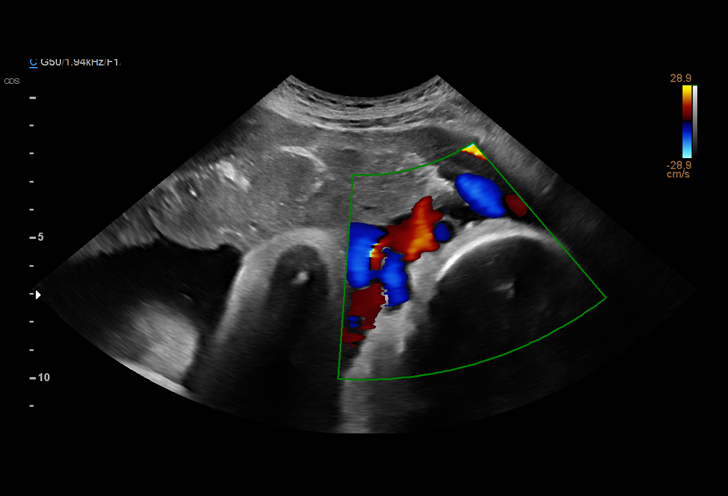

[14 of 23 positions shown; findings below may reference images not displayed]

Indications

 36 weeks gestation of pregnancy
 Velamentous insertion of umbilical cord
 Anemia during pregnancy in third trimester
 Herpes simplex virus (ABOYTES)
Fetal Evaluation

 Num Of Fetuses:         1
 Fetal Heart Rate(bpm):  129
 Cardiac Activity:       Observed
 Presentation:           Cephalic
 Placenta:               Posterior
 P. Cord Insertion:      Velamentous insertion

 Amniotic Fluid
 AFI FV:      Within normal limits

 AFI Sum(cm)     %Tile       Largest Pocket(cm)
 9.49            19

 RUQ(cm)       RLQ(cm)       LUQ(cm)        LLQ(cm)
 4.3           1.87          3.32           0
Biophysical Evaluation
 Amniotic F.V:   Pocket => 2 cm             F. Tone:        Observed
 F. Movement:    Observed                   Score:          [DATE]
 F. Breathing:   Observed
Biometry

 LV:        6.9  mm
OB History

 Gravidity:    6
 Living:       1
Gestational Age

 LMP:           36w 3d        Date:  01/15/20                 EDD:   10/21/20
 Best:          36w 3d     Det. By:  Early Ultrasound         EDD:   10/21/20
Anatomy

 Cranium:               Appears normal         Aortic Arch:            Previously seen
 Cavum:                 Previously seen        Ductal Arch:            Previously seen
 Ventricles:            Appears normal         Diaphragm:              Appears normal
 Choroid Plexus:        Previously seen        Stomach:                Appears normal, left
                                                                       sided
 Cerebellum:            Previously seen        Abdomen:                Appears normal
 Posterior Fossa:       Previously seen        Abdominal Wall:         Previously seen
 Nuchal Fold:           Not applicable (>20    Cord Vessels:           Previously seen
                        wks GA)
 Face:                  Orbits and profile     Kidneys:                Appear normal
                        previously seen
 Lips:                  Previously seen        Bladder:                Appears normal
 Thoracic:              Appears normal         Spine:                  Previously seen
 Heart:                 Appears normal         Upper Extremities:      Previously seen
                        (4CH, axis, and
                        situs)
 RVOT:                  Previously seen        Lower Extremities:      Previously seen
 LVOT:                  Previously seen

 Other:  3VV and 3VTV visualized. IVC/SVC seen. Nasal bone visualized.
Cervix Uterus Adnexa

 Cervix
 Not visualized (advanced GA >80wks)

 Uterus
 No abnormality visualized.

 Right Ovary
 Not visualized.

 Left Ovary
 Within normal limits.
 Cul De Sac
 No free fluid seen.

 Adnexa
 No abnormality visualized.
Impression

 Velamentous cord insertion.  Patient return for antenatal
 testing.
 Amniotic fluid is normal and good fetal activity is seen
 .Antenatal testing is reassuring. BPP [DATE].

 I could not assess placental cord insertion today.
Recommendations

 -Continue weekly BPP till delivery.
                 [place], [place]

## 2021-06-20 DIAGNOSIS — M5416 Radiculopathy, lumbar region: Secondary | ICD-10-CM | POA: Diagnosis not present

## 2021-06-20 IMAGING — US US MFM FETAL BPP W/O NON-STRESS
1 series · 12 of 28 positions shown · non-contrast
Comparison: none

[Series 1: us mfm fetal bpp w/o non-stress · 30 acquisitions, 12 frames shown]
[im 2/30]
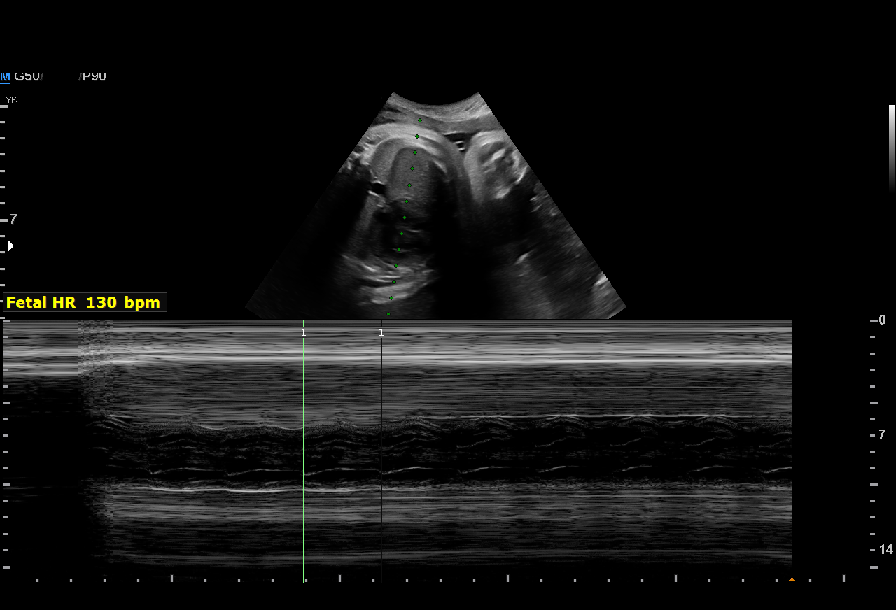
[im 4/30]
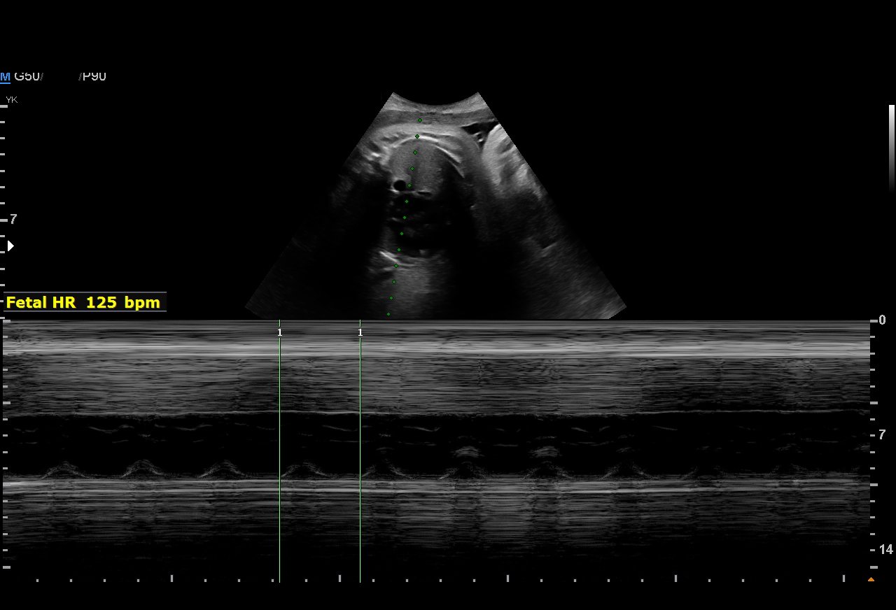
[im 6/30]
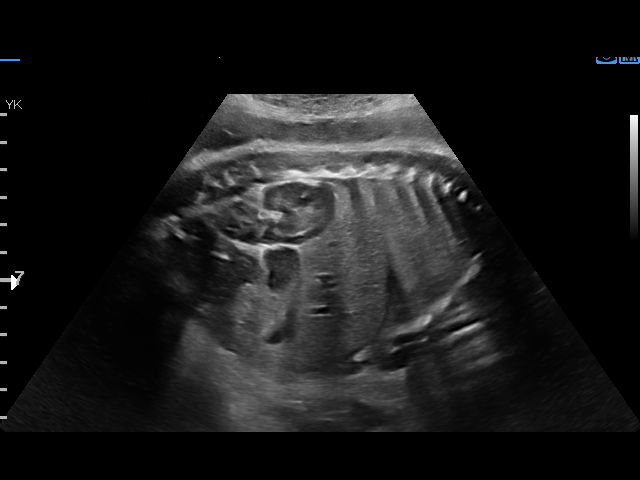
[im 9/30]
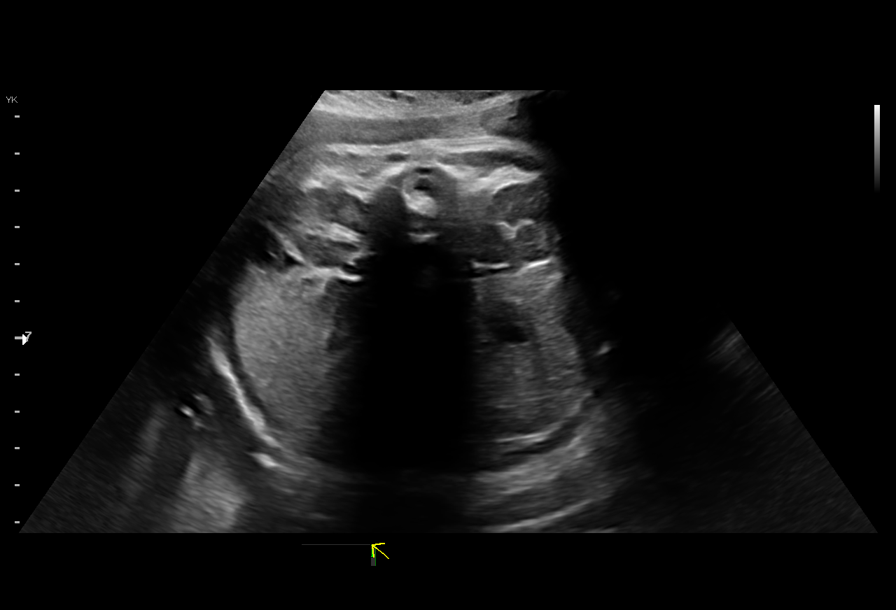
[im 11/30]
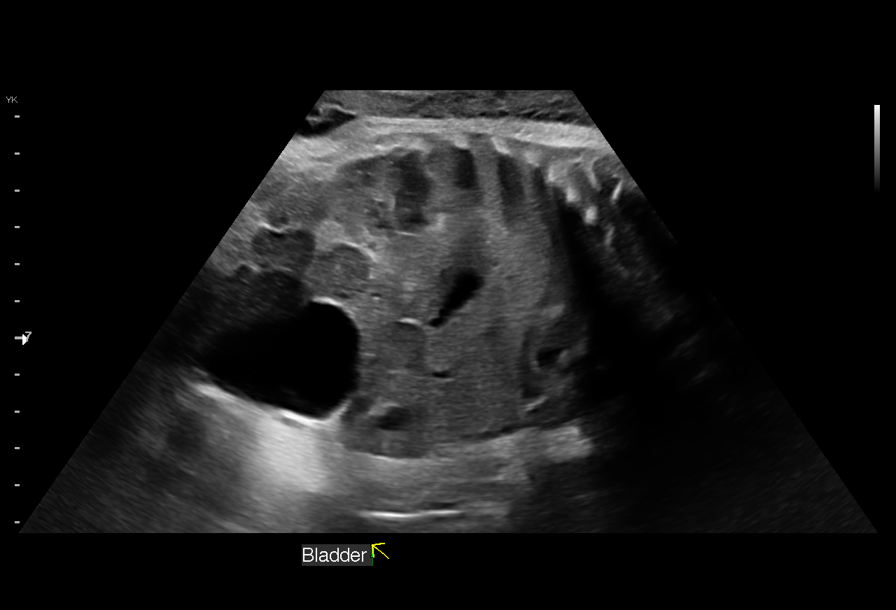
[im 13/30]
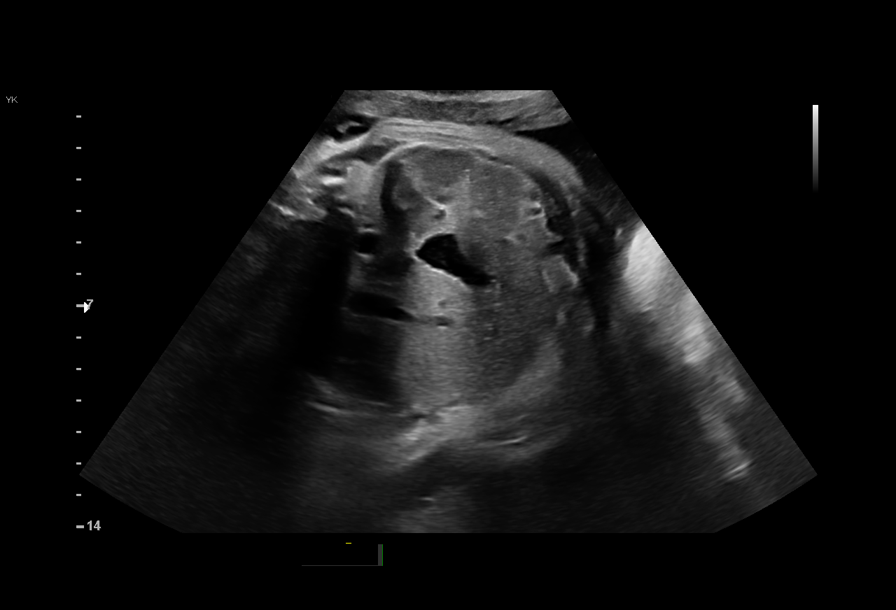
[im 17/30]
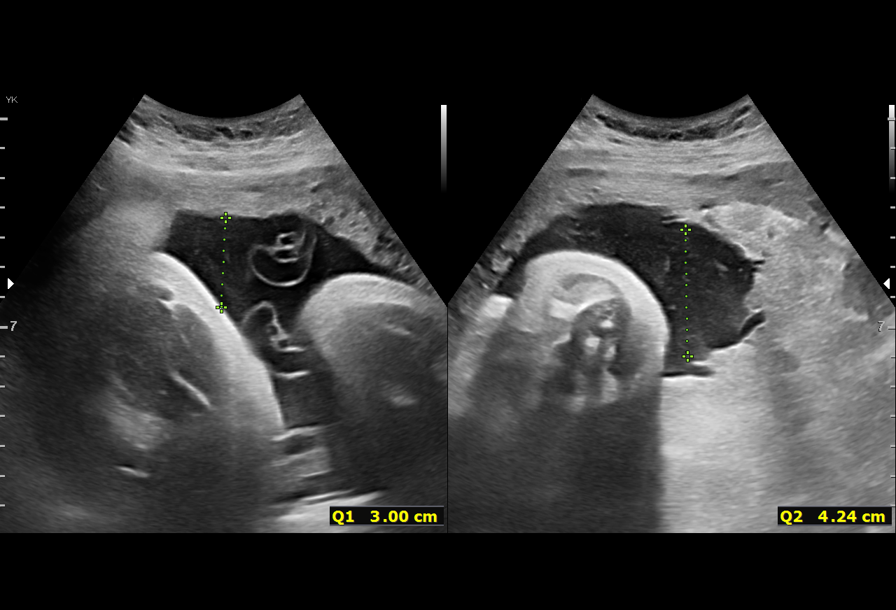
[im 19/30]
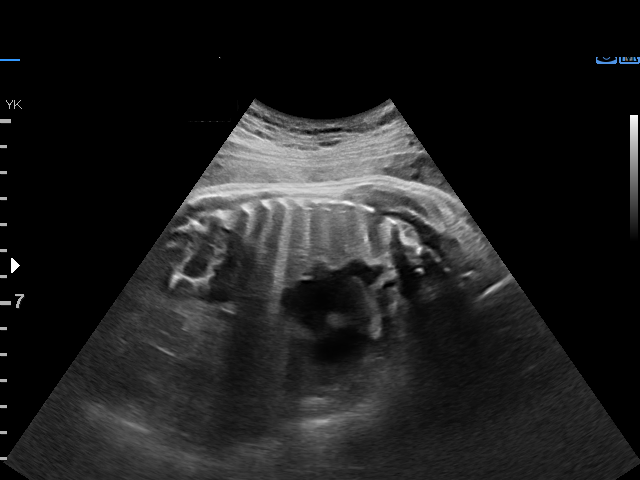
[im 21/30]
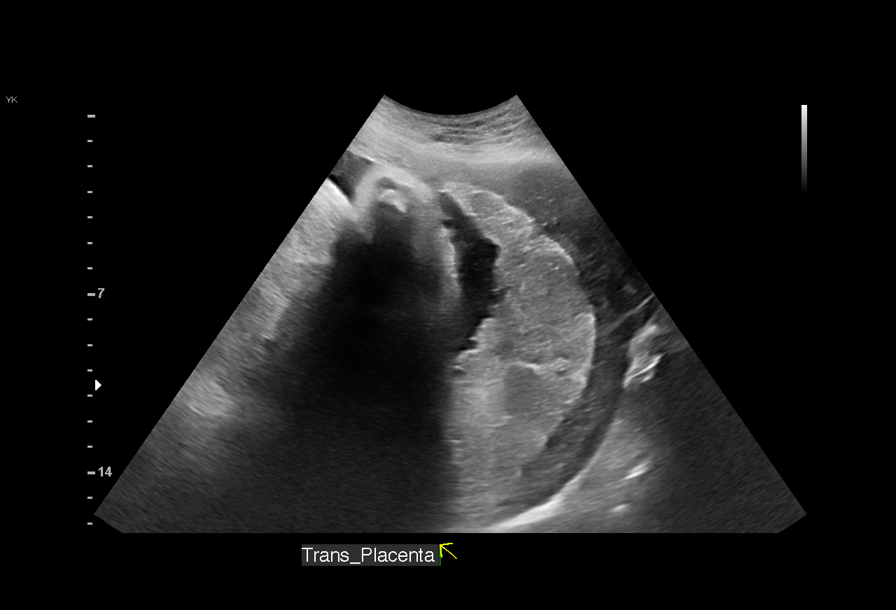
[im 24/30]
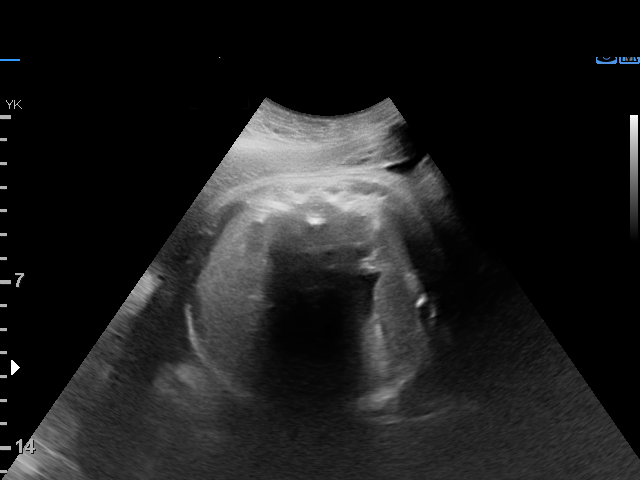
[im 26/30]
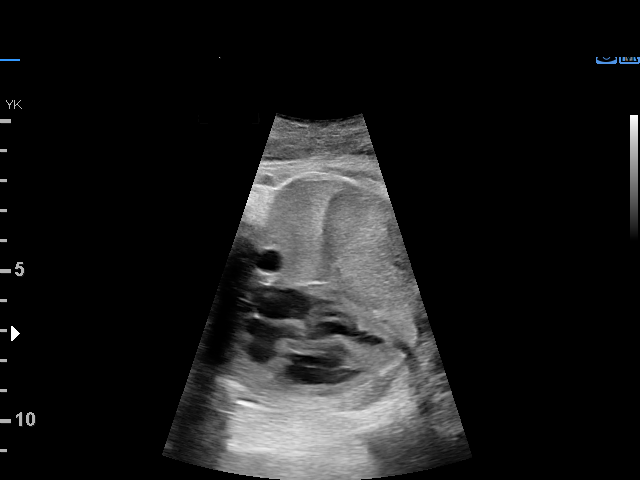
[im 28/30]
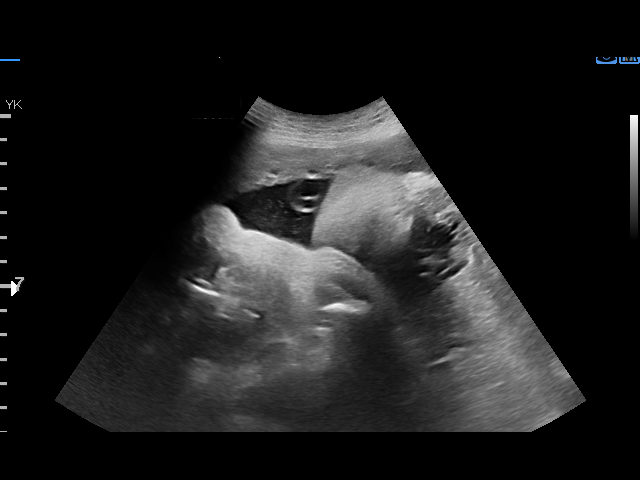

[12 of 28 positions shown; findings below may reference images not displayed]

Indications

 Velamentous insertion of umbilical cord
 38 weeks gestation of pregnancy
 Anemia during pregnancy in third trimester
 Herpes simplex virus (ROMCH)
Fetal Evaluation

 Num Of Fetuses:         1
 Fetal Heart Rate(bpm):  135
 Cardiac Activity:       Observed
 Presentation:           Cephalic
 Placenta:               Posterior
 P. Cord Insertion:      Velamentous insertion (previo)

 Amniotic Fluid
 AFI FV:      Within normal limits

 AFI Sum(cm)     %Tile       Largest Pocket(cm)
 12.89           49

 RUQ(cm)       RLQ(cm)       LUQ(cm)        LLQ(cm)
 3
Biophysical Evaluation
 Amniotic F.V:   Within normal limits       F. Tone:        Observed
 F. Movement:    Observed                   Score:          [DATE]
 F. Breathing:   Observed
OB History

 Gravidity:    6
 Living:       1
Gestational Age

 LMP:           38w 2d        Date:  01/15/20                 EDD:   10/21/20
 Best:          38w 2d     Det. By:  Early Ultrasound         EDD:   10/21/20
Anatomy

 Cranium:               Appears normal         Kidneys:                Appear normal
 Ventricles:            Appears normal         Bladder:                Appears normal
 Stomach:               Appears normal, left
                        sided
Comments

 This patient was seen for a biophysical profile due to a
 velamentous cord insertion.  She denies any problems since
 her last exam.
 A biophysical profile performed today was [DATE].
 There was normal amniotic fluid noted on today's ultrasound
 exam.
 She will return in 1 week for another NST should she remain
 undelivered.

## 2021-06-29 DIAGNOSIS — F4321 Adjustment disorder with depressed mood: Secondary | ICD-10-CM | POA: Diagnosis not present

## 2021-07-10 DIAGNOSIS — M545 Low back pain, unspecified: Secondary | ICD-10-CM | POA: Diagnosis not present

## 2021-07-10 DIAGNOSIS — F4321 Adjustment disorder with depressed mood: Secondary | ICD-10-CM | POA: Diagnosis not present

## 2021-08-03 DIAGNOSIS — M5416 Radiculopathy, lumbar region: Secondary | ICD-10-CM | POA: Diagnosis not present

## 2021-11-06 ENCOUNTER — Ambulatory Visit (HOSPITAL_COMMUNITY)
Admission: EM | Admit: 2021-11-06 | Discharge: 2021-11-06 | Disposition: A | Discharge status: Home / Self Care | Payer: Medicaid Other | Attending: Emergency Medicine | Admitting: Emergency Medicine

## 2021-11-06 ENCOUNTER — Encounter (HOSPITAL_COMMUNITY): Payer: Self-pay

## 2021-11-06 ENCOUNTER — Other Ambulatory Visit: Payer: Self-pay

## 2021-11-06 ENCOUNTER — Ambulatory Visit (INDEPENDENT_AMBULATORY_CARE_PROVIDER_SITE_OTHER): Payer: Medicaid Other

## 2021-11-06 DIAGNOSIS — R1011 Right upper quadrant pain: Secondary | ICD-10-CM

## 2021-11-06 DIAGNOSIS — K5904 Chronic idiopathic constipation: Secondary | ICD-10-CM | POA: Diagnosis not present

## 2021-11-06 LAB — POCT URINALYSIS DIPSTICK, ED / UC
Bilirubin Urine: NEGATIVE
Glucose, UA: NEGATIVE mg/dL
Hgb urine dipstick: NEGATIVE
Ketones, ur: NEGATIVE mg/dL
Leukocytes,Ua: NEGATIVE
Nitrite: NEGATIVE
Protein, ur: NEGATIVE mg/dL
Specific Gravity, Urine: 1.025 (ref 1.005–1.030)
Urobilinogen, UA: 1 mg/dL (ref 0.0–1.0)
pH: 7 (ref 5.0–8.0)

## 2021-11-06 MED ORDER — LINACLOTIDE 145 MCG PO CAPS: 145.0000 ug | ORAL_CAPSULE | Freq: Every day | ORAL | 0 refills | Status: AC

## 2021-11-06 NOTE — ED Provider Notes (Signed)
[facility]    CSN: [id number] Arrival date & time: 11/06/21  1746    HISTORY   Chief Complaint  Patient presents with   Abdominal Pain   HPI Debbie Greene is a 36 y.o. female. Patient presents with right upper quadrant pain for about a week.  Patient states initially she felt like the pain was related to her upcoming menstrual cycle, however it persisted after her period began.  Patient states the pain is constant throughout the day and night but significantly worsens at night.  Patient describes the pain as stabbing and burning.  Patient states it is usually in her right upper quadrant but sometimes drips across her abdomen to her left upper quadrant and sometimes hurts in her left back beneath her ribs.  Patient denies fever, aches, chills.  Patient reports normal bowel movements with normal formed, normal colored stool.  Patient denies nausea, dizziness, body aches.  Patient states she is never had this kind of pain before.  The history is provided by the patient.  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    OB History     Gravida  6   Para  2   Term  2   Preterm      AB  4   Living  2      SAB      IAB  4   Ectopic      Multiple  0   Live Births  2          Home  Medications    Prior to Admission medications   Medication Sig Start Date End Date Taking? Authorizing Provider  ferrous sulfate 325 (65 FE) MG tablet Take 1 tablet (325 mg total) by mouth every other day. 07/30/20   Leftwich-Kirby, Wilmer Floor, CNM  ibuprofen (ADVIL) 600 MG tablet Take 1 tablet (600 mg total) by mouth every 6 (six) hours. 10/14/20   [place], Lesly Rubenstein, FNP  Prenat w/o A Vit-FeFum-FePo-FA (CONCEPT OB) 130-92.4-1 MG CAPS Take 1 capsule by mouth daily. 07/30/20   Leftwich-Kirby, Wilmer Floor, CNM  valACYclovir (VALTREX) 1000 MG tablet Take 1 tablet (1,000 mg total) by mouth daily. 07/23/20   Brock Bad, MD    Family History Family History  Problem Relation Age of Onset   Cancer Maternal Grandmother        Breast   Asthma Mother    Hypertension Mother    Social History Social History   Tobacco Use   Smoking status: Never   Smokeless tobacco: Never  Vaping Use   Vaping Use: Never used  Substance Use Topics   Alcohol use: Not Currently    Comment: Occassionally.    Drug use: No   Allergies   Patient has no known allergies.  Review of Systems Review of Systems Pertinent findings noted in history of present illness.   Physical Exam Triage Vital Signs ED Triage Vitals  Enc Vitals Group     BP 08/14/21 0827 (!) 147/82     Pulse Rate 08/14/21 0827 72     Resp 08/14/21 0827 18     Temp 08/14/21 0827 98.3 F (36.8 C)     Temp Source 08/14/21 0827 Oral     SpO2 08/14/21 0827 98 %     Weight --      Height --      Head Circumference --      Peak Flow --      Pain Score 08/14/21 0826 5     Pain Loc --      Pain Edu? --      Excl. in GC? --   No data found.  Updated Vital Signs BP 115/82 (BP Location: Left Arm)    Pulse (!) 58    Temp 98.4 F (36.9 C) (Oral)    Resp 17    LMP 10/31/2021    SpO2 98%   Physical Exam Vitals and nursing note reviewed.  Constitutional:      General: She is not in acute distress.    Appearance: Normal appearance. She is not  ill-appearing.  HENT:     Head: Normocephalic and atraumatic.  Eyes:     General: Lids are normal.        Right eye: No discharge.        Left eye: No discharge.     Extraocular Movements: Extraocular movements intact.     Conjunctiva/sclera: Conjunctivae normal.     Right eye: Right conjunctiva is not injected.     Left eye: Left conjunctiva is not injected.  Neck:     Trachea: Trachea and phonation normal.  Cardiovascular:     Rate and Rhythm: Normal rate and regular rhythm.     Pulses: Normal pulses.     Heart sounds: Normal heart sounds. No murmur heard.   No friction rub. No gallop.  Pulmonary:     Effort: Pulmonary effort is normal. No accessory muscle usage, prolonged expiration or respiratory distress.     Breath sounds: Normal breath sounds. No stridor, decreased air movement or transmitted upper airway sounds. No decreased breath sounds, wheezing, rhonchi or rales.  Chest:     Chest wall: No tenderness.  Abdominal:     General: Abdomen is flat. Bowel sounds are normal. There is no distension.     Palpations: Abdomen is soft.     Tenderness: There is abdominal tenderness (Mild) in the right upper quadrant and epigastric area. There is no right CVA tenderness, left CVA tenderness, guarding or rebound. Negative signs include Murphy's sign, Rovsing's sign, McBurney's sign, psoas sign and obturator sign.     Hernia: No hernia is present.  Musculoskeletal:        General: Normal range of motion.     Cervical back: Normal range of motion and neck supple. Normal range of motion.  Lymphadenopathy:     Cervical: No cervical adenopathy.  Skin:    General: Skin is warm and dry.     Findings: No erythema or rash.  Neurological:     General: No focal deficit present.     Mental Status: She is alert and oriented to person, place, and time.  Psychiatric:        Mood and Affect: Mood normal.  Behavior: Behavior normal.    Visual Acuity Right Eye Distance:   Left Eye  Distance:   Bilateral Distance:    Right Eye Near:   Left Eye Near:    Bilateral Near:     UC Couse / Diagnostics / Procedures:   Clinical Course as of 11/06/21 1958  Fri Nov 06, 2021  1928 U/a reviewed, KUB ordered. [LM]    Clinical Course User Index [LM] Theadora RamaMorgan, Jihan Mellette Scales, PA-C   EKG  Radiology DG Abd 1 View  Result Date: 11/06/2021 CLINICAL DATA:  Right upper quadrant abdominal pain EXAM: ABDOMEN - 1 VIEW COMPARISON:  None. FINDINGS: Normal abdominal gas pattern. Moderate stool seen throughout the colon and rectal vault. No gross free intraperitoneal gas. No organomegaly. Multiple phleboliths noted within the left hemipelvis. No acute bone abnormality. IMPRESSION: Nonobstructive bowel gas pattern.  Moderate stool. Electronically Signed   By: Helyn NumbersAshesh  Parikh M.D.   On: 11/06/2021 19:32    Procedures Procedures (including critical care time)  UC Diagnoses / Final Clinical Impressions(s)   I have reviewed the triage vital signs and the nursing notes.  Pertinent labs & imaging results that were available during my care of the patient were reviewed by me and considered in my medical decision making (see chart for details).    Final diagnoses:  Abdominal pain, right upper quadrant  Chronic idiopathic constipation   Patient has a moderate stool burden on x-ray.  Pelvis is likely the cause of her upper quadrant discomfort.  Begin Linzess.  Follow-up with PCP if not improving. ED Prescriptions     Medication Sig Dispense Auth. Provider   linaclotide (LINZESS) 145 MCG CAPS capsule Take 1 capsule (145 mcg total) by mouth daily before breakfast. 30 capsule Theadora RamaMorgan, Zayquan Bogard Scales, PA-C      PDMP not reviewed this encounter.  Pending results:  Labs Reviewed  POCT URINALYSIS DIPSTICK, ED / UC    Medications Ordered in UC: Medications - No data to display  Disposition Upon Discharge:  Condition: stable for discharge home Home: take medications as prescribed; routine  discharge instructions as discussed; follow up as advised.  Patient presented with an acute illness with associated systemic symptoms and significant discomfort requiring urgent management. In my opinion, this is a condition that a prudent lay person (someone who possesses an average knowledge of health and medicine) may potentially expect to result in complications if not addressed urgently such as respiratory distress, impairment of bodily function or dysfunction of bodily organs.   Routine symptom specific, illness specific and/or disease specific instructions were discussed with the patient and/or caregiver at length.   As such, the patient has been evaluated and assessed, work-up was performed and treatment was provided in alignment with urgent care protocols and evidence based medicine.  Patient/parent/caregiver has been advised that the patient may require follow up for further testing and treatment if the symptoms continue in spite of treatment, as clinically indicated and appropriate.  If the patient was tested for COVID-19, Influenza and/or RSV, then the patient/parent/guardian was advised to isolate at home pending the results of his/her diagnostic coronavirus test and potentially longer if theyre positive. I have also advised pt that if his/her COVID-19 test returns positive, it's recommended to self-isolate for at least 10 days after symptoms first appeared AND until fever-free for 24 hours without fever reducer AND other symptoms have improved or resolved. Discussed self-isolation recommendations as well as instructions for household member/close contacts as per the CDC and [place] DHHS, and also  gave patient the COVID packet with this information.  Patient/parent/caregiver has been advised to return to the [facility] or PCP in 3-5 days if no better; to PCP or the Emergency Department if new signs and symptoms develop, or if the current signs or symptoms continue to change or worsen for further workup,  evaluation and treatment as clinically indicated and appropriate  The patient will follow up with their current PCP if and as advised. If the patient does not currently have a PCP we will assist them in obtaining one.   The patient may need specialty follow up if the symptoms continue, in spite of conservative treatment and management, for further workup, evaluation, consultation and treatment as clinically indicated and appropriate.   Patient/parent/caregiver verbalized understanding and agreement of plan as discussed.  All questions were addressed during visit.  Please see discharge instructions below for further details of plan.  Discharge Instructions:   Discharge Instructions      The x-ray of your abdomen did reveal a moderate amount of stool with gas backed up behind it.  Gas backed up behind stool, this constrict the intestines and cause sharp pain.  The bowels can stretch like balloons but it definitely causes significant discomfort this happens.  I recommend that you begin Linzess, a very effective osmotic medication that pulls significant amounts of water into your stool.  It is not habit-forming, does not cause cramping or discomfort and is very effective.  You can take this medication daily if needed however if you find that it is "too effective", you can decide to take it every other day or every third day as you wish.  After taking Linzess for several days, if you have not had significant improvement of your right upper quadrant pain, please follow-up with your primary care provider or the emergency room, a CT scan of your abdomen will provide a more detailed evaluation.  Thank you for visiting urgent care today.  I appreciate the opportunity to participate in your care.      This office note has been dictated using Teaching laboratory technician.  Unfortunately, and despite my best efforts, this method of dictation can sometimes lead to occasional typographical or  grammatical errors.  I apologize in advance if this occurs.     Theadora Rama Scales, PA-C 11/06/21 1958

## 2021-11-06 NOTE — ED Triage Notes (Signed)
Pt presents with RUQ abdominal pain for about a week.

## 2021-11-06 NOTE — Discharge Instructions (Addendum)
The x-ray of your abdomen did reveal a moderate amount of stool with gas backed up behind it.  Gas backed up behind stool, this constrict the intestines and cause sharp pain.  The bowels can stretch like balloons but it definitely causes significant discomfort this happens.  I recommend that you begin Linzess, a very effective osmotic medication that pulls significant amounts of water into your stool.  It is not habit-forming, does not cause cramping or discomfort and is very effective.  You can take this medication daily if needed however if you find that it is "too effective", you can decide to take it every other day or every third day as you wish.  After taking Linzess for several days, if you have not had significant improvement of your right upper quadrant pain, please follow-up with your primary care provider or the emergency room, a CT scan of your abdomen will provide a more detailed evaluation.  Thank you for visiting urgent care today.  I appreciate the opportunity to participate in your care.

## 2021-12-07 ENCOUNTER — Ambulatory Visit (HOSPITAL_COMMUNITY)
Admission: EM | Admit: 2021-12-07 | Discharge: 2021-12-07 | Disposition: A | Discharge status: Home / Self Care | Payer: BC Managed Care – PPO | Attending: Family Medicine | Admitting: Family Medicine

## 2021-12-12 ENCOUNTER — Emergency Department (HOSPITAL_BASED_OUTPATIENT_CLINIC_OR_DEPARTMENT_OTHER)
Admission: EM | Admit: 2021-12-12 | Discharge: 2021-12-13 | Disposition: A | Discharge status: Home / Self Care | Payer: BC Managed Care – PPO | Attending: Emergency Medicine | Admitting: Emergency Medicine

## 2021-12-12 ENCOUNTER — Encounter (HOSPITAL_BASED_OUTPATIENT_CLINIC_OR_DEPARTMENT_OTHER): Payer: Self-pay

## 2021-12-12 ENCOUNTER — Emergency Department (HOSPITAL_BASED_OUTPATIENT_CLINIC_OR_DEPARTMENT_OTHER): Payer: BC Managed Care – PPO

## 2021-12-12 ENCOUNTER — Other Ambulatory Visit: Payer: Self-pay

## 2021-12-12 ENCOUNTER — Emergency Department (HOSPITAL_BASED_OUTPATIENT_CLINIC_OR_DEPARTMENT_OTHER): Payer: BC Managed Care – PPO | Admitting: Radiology

## 2021-12-12 DIAGNOSIS — R079 Chest pain, unspecified: Secondary | ICD-10-CM | POA: Diagnosis not present

## 2021-12-12 DIAGNOSIS — K5900 Constipation, unspecified: Secondary | ICD-10-CM | POA: Insufficient documentation

## 2021-12-12 DIAGNOSIS — R109 Unspecified abdominal pain: Secondary | ICD-10-CM | POA: Diagnosis not present

## 2021-12-12 DIAGNOSIS — R1011 Right upper quadrant pain: Secondary | ICD-10-CM | POA: Insufficient documentation

## 2021-12-12 DIAGNOSIS — K2900 Acute gastritis without bleeding: Secondary | ICD-10-CM | POA: Diagnosis not present

## 2021-12-12 DIAGNOSIS — R1013 Epigastric pain: Secondary | ICD-10-CM | POA: Diagnosis not present

## 2021-12-12 DIAGNOSIS — R110 Nausea: Secondary | ICD-10-CM | POA: Diagnosis not present

## 2021-12-12 LAB — COMPREHENSIVE METABOLIC PANEL WITH GFR
ALT: 8 U/L (ref 0–44)
AST: 11 U/L — ABNORMAL LOW (ref 15–41)
Albumin: 4.5 g/dL (ref 3.5–5.0)
Alkaline Phosphatase: 41 U/L (ref 38–126)
Anion gap: 9 (ref 5–15)
BUN: 11 mg/dL (ref 6–20)
CO2: 27 mmol/L (ref 22–32)
Calcium: 9.4 mg/dL (ref 8.9–10.3)
Chloride: 103 mmol/L (ref 98–111)
Creatinine, Ser: 0.73 mg/dL (ref 0.44–1.00)
GFR, Estimated: >60 mL/min
Glucose, Bld: 85 mg/dL (ref 70–99)
Potassium: 3.6 mmol/L (ref 3.5–5.1)
Sodium: 139 mmol/L (ref 135–145)
Total Bilirubin: 0.4 mg/dL (ref 0.3–1.2)
Total Protein: 7.7 g/dL (ref 6.5–8.1)

## 2021-12-12 LAB — URINALYSIS, ROUTINE W REFLEX MICROSCOPIC
Bilirubin Urine: NEGATIVE
Glucose, UA: NEGATIVE mg/dL
Hgb urine dipstick: NEGATIVE
Ketones, ur: >80 mg/dL — AB
Leukocytes,Ua: NEGATIVE
Nitrite: NEGATIVE
Protein, ur: 30 mg/dL — AB
Specific Gravity, Urine: 1.025 (ref 1.005–1.030)
pH: 7 (ref 5.0–8.0)

## 2021-12-12 LAB — CBC WITH DIFFERENTIAL/PLATELET
Abs Immature Granulocytes: 0.01 K/uL (ref 0.00–0.07)
Basophils Absolute: 0 K/uL (ref 0.0–0.1)
Basophils Relative: 0 %
Eosinophils Absolute: 0.1 K/uL (ref 0.0–0.5)
Eosinophils Relative: 2 %
HCT: 31.9 % — ABNORMAL LOW (ref 36.0–46.0)
Hemoglobin: 9.9 g/dL — ABNORMAL LOW (ref 12.0–15.0)
Immature Granulocytes: 0 %
Lymphocytes Relative: 23 %
Lymphs Abs: 1.3 K/uL (ref 0.7–4.0)
MCH: 24.6 pg — ABNORMAL LOW (ref 26.0–34.0)
MCHC: 31 g/dL (ref 30.0–36.0)
MCV: 79.4 fL — ABNORMAL LOW (ref 80.0–100.0)
Monocytes Absolute: 0.4 K/uL (ref 0.1–1.0)
Monocytes Relative: 7 %
Neutro Abs: 3.8 K/uL (ref 1.7–7.7)
Neutrophils Relative %: 68 %
Platelets: 325 K/uL (ref 150–400)
RBC: 4.02 MIL/uL (ref 3.87–5.11)
RDW: 14.7 % (ref 11.5–15.5)
WBC: 5.6 K/uL (ref 4.0–10.5)
nRBC: 0 % (ref 0.0–0.2)

## 2021-12-12 LAB — PREGNANCY, URINE: Preg Test, Ur: NEGATIVE

## 2021-12-12 LAB — LIPASE, BLOOD: Lipase: <10 U/L — ABNORMAL LOW (ref 11–51)

## 2021-12-12 MED ORDER — SODIUM CHLORIDE 0.9 % IV BOLUS
500.0000 mL | Freq: Once | INTRAVENOUS | Status: AC
  Administered 2021-12-12: 500 mL via INTRAVENOUS

## 2021-12-12 MED ORDER — FENTANYL CITRATE PF 50 MCG/ML IJ SOSY
50.0000 ug | PREFILLED_SYRINGE | Freq: Once | INTRAMUSCULAR | Status: AC
  Administered 2021-12-12: 50 ug via INTRAVENOUS
  Filled 2021-12-12: qty 1

## 2021-12-12 MED ORDER — ONDANSETRON HCL 4 MG/2ML IJ SOLN
4.0000 mg | Freq: Once | INTRAMUSCULAR | Status: AC
  Administered 2021-12-12: 4 mg via INTRAVENOUS

## 2021-12-12 MED ORDER — PANTOPRAZOLE SODIUM 40 MG IV SOLR
40.0000 mg | Freq: Once | INTRAVENOUS | Status: AC
  Administered 2021-12-12: 40 mg via INTRAVENOUS
  Filled 2021-12-12: qty 10

## 2021-12-12 MED ORDER — PANTOPRAZOLE SODIUM 20 MG PO TBEC: 20.0000 mg | DELAYED_RELEASE_TABLET | Freq: Every day | ORAL | 0 refills | Status: AC

## 2021-12-12 NOTE — ED Notes (Signed)
Ultrasound at beside.  

## 2021-12-12 NOTE — ED Triage Notes (Addendum)
Pt arrives POV with c/o of constipation and abdominal pain, states pain is radiating into back and chest.  States Monday was last bowel movement after taking laxative on Sunday night.  She did an enema last night and has tried prune juice with no results.  Was seen at [facility] in January for the same, but pt states it is worse now.

## 2021-12-12 NOTE — ED Provider Notes (Signed)
°  Physical Exam  BP 104/75    Pulse 72    Temp 99.7 F (37.6 C)    Resp 16    Ht 5' 7.75" (1.721 m)    Wt 76.2 kg    LMP 11/22/2021 (Exact Date)    SpO2 100%    BMI 25.73 kg/m   Physical Exam Vitals and nursing note reviewed.  Constitutional:      General: She is not in acute distress.    Appearance: She is well-developed. She is not diaphoretic.  HENT:     Head: Normocephalic and atraumatic.  Pulmonary:     Effort: Pulmonary effort is normal.  Musculoskeletal:     Cervical back: Normal range of motion and neck supple.  Skin:    General: Skin is warm and dry.  Neurological:     General: No focal deficit present.     Mental Status: She is alert and oriented to person, place, and time.    Procedures  Procedures  ED Course / MDM    Care assumed from Dr. Fredderick Phenix at shift change.  Patient presenting here with a 1 month history of worsening epigastric pain.  Care signed out to me awaiting results of a CT scan.  This has resulted and shows thickening of the distal stomach and duodenum consistent with gastritis/possible peptic ulcer disease.  Patient reevaluated and appears clinically well.  I feel as though patient can safely be discharged with prescriptions for Protonix and Carafate.  Referral to GI will be placed for follow-up.  To return as needed if symptoms worsen or change.       Geoffery Lyons, MD 12/13/21 0001

## 2021-12-12 NOTE — ED Provider Notes (Signed)
[facility] EMERGENCY DEPT Provider Note   CSN: [id number] Arrival date & time: 12/12/21  1641     History  Chief Complaint  Patient presents with   Constipation    Debbie Greene is a 36 y.o. female.  Patient is a 36 year old female who presents with pain in her upper abdomen.  She says is been going on for about a month.  It is intermittent.  It is associated with some nausea and occasional vomiting.  It sometimes radiates across her upper abdomen and into her epigastrium/lower chest.  She does say it is worse at night and although the pain is not worse after eating, she gets nauseated and does not have an appetite after she eats.  She denies any known fevers.  She has had some associated constipation.  She has been taking laxatives and will have a bowel movement but then is constipated again.  She has been seen in urgent care and was prescribed Linzess without improvement in symptoms.  She has been using ibuprofen for symptomatic relief.  She does not take any opioids.  She had a history of constipation in 2013 but does not have ongoing constipation issues.      Home Medications Prior to Admission medications   Medication Sig Start Date End Date Taking? Authorizing Provider  pantoprazole (PROTONIX) 20 MG tablet Take 1 tablet (20 mg total) by mouth daily. 12/12/21  Yes Malvin Johns, MD  ferrous sulfate 325 (65 FE) MG tablet Take 1 tablet (325 mg total) by mouth every other day. 07/30/20   Leftwich-Kirby, Kathie Dike, CNM  ibuprofen (ADVIL) 600 MG tablet Take 1 tablet (600 mg total) by mouth every 6 (six) hours. 10/14/20   Noralyn Pick, FNP  linaclotide [facility]) 145 MCG CAPS capsule Take 1 capsule (145 mcg total) by mouth daily before breakfast. 11/06/21 12/06/21  Lynden Oxford Scales, PA-C  Prenat w/o A Vit-FeFum-FePo-FA (CONCEPT OB) 130-92.4-1 MG CAPS Take 1 capsule by mouth daily. 07/30/20   Leftwich-Kirby, Kathie Dike, CNM  valACYclovir (VALTREX) 1000 MG tablet Take 1  tablet (1,000 mg total) by mouth daily. 07/23/20   Shelly Bombard, MD      Allergies    Patient has no known allergies.    Review of Systems   Review of Systems  Constitutional:  Positive for appetite change. Negative for chills, diaphoresis, fatigue and fever.  HENT:  Negative for congestion, rhinorrhea and sneezing.   Eyes: Negative.   Respiratory:  Negative for cough, chest tightness and shortness of breath.   Cardiovascular:  Negative for chest pain and leg swelling.  Gastrointestinal:  Positive for abdominal pain, constipation, nausea and vomiting. Negative for blood in stool and diarrhea.  Genitourinary:  Negative for difficulty urinating, flank pain, frequency and hematuria.  Musculoskeletal:  Negative for arthralgias and back pain.  Skin:  Negative for rash.  Neurological:  Negative for dizziness, speech difficulty, weakness, numbness and headaches.   Physical Exam Updated Vital Signs BP 111/65    Pulse 71    Temp 99.7 F (37.6 C)    Resp 18    Ht 5' 7.75" (1.721 m)    Wt 76.2 kg    LMP 11/22/2021 (Exact Date)    SpO2 98%    BMI 25.73 kg/m  Physical Exam Constitutional:      Appearance: She is well-developed.  HENT:     Head: Normocephalic and atraumatic.  Eyes:     Pupils: Pupils are equal, round, and reactive to light.  Cardiovascular:  Rate and Rhythm: Normal rate and regular rhythm.     Heart sounds: Normal heart sounds.  Pulmonary:     Effort: Pulmonary effort is normal. No respiratory distress.     Breath sounds: Normal breath sounds. No wheezing or rales.  Chest:     Chest wall: No tenderness.  Abdominal:     General: Bowel sounds are normal.     Palpations: Abdomen is soft.     Tenderness: There is no abdominal tenderness (Right upper quadrant and epigastrium). There is no guarding or rebound.  Musculoskeletal:        General: Normal range of motion.     Cervical back: Normal range of motion and neck supple.  Lymphadenopathy:     Cervical: No  cervical adenopathy.  Skin:    General: Skin is warm and dry.     Findings: No rash.  Neurological:     Mental Status: She is alert and oriented to person, place, and time.    ED Results / Procedures / Treatments   Labs (all labs ordered are listed, but only abnormal results are displayed) Labs Reviewed  CBC WITH DIFFERENTIAL/PLATELET - Abnormal; Notable for the following components:      Result Value   Hemoglobin 9.9 (*)    HCT 31.9 (*)    MCV 79.4 (*)    MCH 24.6 (*)    All other components within normal limits  COMPREHENSIVE METABOLIC PANEL - Abnormal; Notable for the following components:   AST 11 (*)    All other components within normal limits  LIPASE, BLOOD - Abnormal; Notable for the following components:   Lipase <10 (*)    All other components within normal limits  URINALYSIS, ROUTINE W REFLEX MICROSCOPIC  PREGNANCY, URINE    EKG None  Radiology US Abdomen Limited RUQ (LIVER/GB)  Result Date: 12/12/2021 CLINICAL DATA:  Right upper quadrant and epigastric pain. Chest pain with shortness of breath. Nausea today. EXAM: ULTRASOUND ABDOMEN LIMITED RIGHT UPPER QUADRANT COMPARISON:  Ultrasound 02/08/2014. FINDINGS: Gallbladder: Physiologically distended. No gallstones or wall thickening visualized. No sonographic Murphy sign noted by sonographer. Common bile duct: Diameter: 3-4 mm, normal. Liver: Left circumscribed 7 mm hyperechoic lesion in the right lobe of the liver. There is an adjacent 8 mm hyperechoic lesion. Within normal limits in parenchymal echogenicity. Portal vein is patent on color Doppler imaging with normal direction of blood flow towards the liver. Other: No right upper quadrant ascites. IMPRESSION: 1. Normal sonographic appearance of the gallbladder and biliary tree. 2. Two subcentimeter hyperechoic lesions in the right hepatic lobe are most consistent with hemangiomas. Recommend six-month follow-up ultrasound to confirm stability. Electronically Signed   By:  Keith Rake M.D.   On: 12/12/2021 22:18    Procedures Procedures    Medications Ordered in ED Medications  fentaNYL (SUBLIMAZE) injection 50 mcg (50 mcg Intravenous Given 12/12/21 2145)  sodium chloride 0.9 % bolus 500 mL (0 mLs Intravenous Stopped 12/12/21 2253)  ondansetron (ZOFRAN) injection 4 mg (4 mg Intravenous Given 12/12/21 2144)  pantoprazole (PROTONIX) injection 40 mg (40 mg Intravenous Given 12/12/21 2145)    ED Course/ Medical Decision Making/ A&P                           Medical Decision Making Amount and/or Complexity of Data Reviewed Labs: ordered. Radiology: ordered.  Risk Prescription drug management.   Patient presented with some upper abdominal pain and constipation.  Her pain seems to  be concerning for gallstones.  She had a gallbladder ultrasound which showed no acute abnormalities.  Labs are nonconcerning.  These were interpreted by me.  Her LFTs are normal.  She is awaiting a urinalysis.  She is feeling better after treatment in the ED with fentanyl and Protonix.  She has a CT abdomen pelvis ordered and pending.  Dr. Stark Jock to take over care pending this.  Final Clinical Impression(s) / ED Diagnoses Final diagnoses:  RUQ pain    Rx / DC Orders ED Discharge Orders          Ordered    pantoprazole (PROTONIX) 20 MG tablet  Daily        12/12/21 2316              Malvin Johns, MD 12/12/21 2318

## 2021-12-12 NOTE — ED Notes (Signed)
ED Provider at bedside. 

## 2021-12-13 MED ORDER — PANTOPRAZOLE SODIUM 20 MG PO TBEC: 20.0000 mg | DELAYED_RELEASE_TABLET | Freq: Two times a day (BID) | ORAL | 0 refills | Status: AC

## 2021-12-13 MED ORDER — SUCRALFATE 1 G PO TABS: 1.0000 g | ORAL_TABLET | Freq: Three times a day (TID) | ORAL | 1 refills | Status: AC

## 2021-12-13 NOTE — ED Notes (Signed)
EMT-P provided AVS using Teachback Method. Patient verbalizes understanding of Discharge Instructions. Opportunity for Questioning and Answers were provided by EMT-P. Patient Discharged from ED.  ? ?

## 2021-12-13 NOTE — Discharge Instructions (Signed)
Begin taking Carafate and Protonix as prescribed.  Follow-up with gastroenterology in the next week.  The contact information for [facility] has been provided in this discharge summary for you to call and make these arrangements.  Return to the ER in the meantime if you develop severe abdominal pain, high fevers, bloody stool or vomit, or other new and concerning symptoms.

## 2022-02-19 ENCOUNTER — Other Ambulatory Visit: Payer: Self-pay | Admitting: Obstetrics and Gynecology

## 2022-02-19 DIAGNOSIS — B009 Herpesviral infection, unspecified: Secondary | ICD-10-CM

## 2022-09-15 ENCOUNTER — Emergency Department (HOSPITAL_COMMUNITY)
Admission: EM | Admit: 2022-09-15 | Discharge: 2022-09-16 | Disposition: A | Discharge status: Home / Self Care | Payer: 59 | Attending: Emergency Medicine | Admitting: Emergency Medicine

## 2022-09-15 ENCOUNTER — Encounter (HOSPITAL_COMMUNITY): Payer: Self-pay | Admitting: *Deleted

## 2022-09-15 ENCOUNTER — Other Ambulatory Visit: Payer: Self-pay

## 2022-09-15 DIAGNOSIS — B349 Viral infection, unspecified: Secondary | ICD-10-CM | POA: Insufficient documentation

## 2022-09-15 DIAGNOSIS — G8929 Other chronic pain: Secondary | ICD-10-CM

## 2022-09-15 DIAGNOSIS — R040 Epistaxis: Secondary | ICD-10-CM | POA: Diagnosis present

## 2022-09-15 DIAGNOSIS — Z1152 Encounter for screening for COVID-19: Secondary | ICD-10-CM | POA: Diagnosis not present

## 2022-09-15 NOTE — ED Triage Notes (Signed)
The pt has had a cold sionce Monday  she has lost her voice  unknown temp.  Drainage from here sinuses today has had intermittent nose bleeds. Lmp today

## 2022-09-15 NOTE — ED Triage Notes (Signed)
No nose bleed at present non-productive cough

## 2022-09-16 ENCOUNTER — Emergency Department (HOSPITAL_COMMUNITY): Payer: 59

## 2022-09-16 DIAGNOSIS — R040 Epistaxis: Secondary | ICD-10-CM | POA: Diagnosis not present

## 2022-09-16 LAB — CBC
HCT: 28.6 % — ABNORMAL LOW (ref 36.0–46.0)
Hemoglobin: 8.8 g/dL — ABNORMAL LOW (ref 12.0–15.0)
MCH: 24.7 pg — ABNORMAL LOW (ref 26.0–34.0)
MCHC: 30.8 g/dL (ref 30.0–36.0)
MCV: 80.3 fL (ref 80.0–100.0)
Platelets: 349 10*3/uL (ref 150–400)
RBC: 3.56 MIL/uL — ABNORMAL LOW (ref 3.87–5.11)
RDW: 15.5 % (ref 11.5–15.5)
WBC: 8 10*3/uL (ref 4.0–10.5)
nRBC: 0 % (ref 0.0–0.2)

## 2022-09-16 LAB — COMPREHENSIVE METABOLIC PANEL
ALT: 15 U/L (ref 0–44)
AST: 21 U/L (ref 15–41)
Albumin: 3.5 g/dL (ref 3.5–5.0)
Alkaline Phosphatase: 54 U/L (ref 38–126)
Anion gap: 6 (ref 5–15)
BUN: 9 mg/dL (ref 6–20)
CO2: 24 mmol/L (ref 22–32)
Calcium: 8.8 mg/dL — ABNORMAL LOW (ref 8.9–10.3)
Chloride: 108 mmol/L (ref 98–111)
Creatinine, Ser: 0.76 mg/dL (ref 0.44–1.00)
GFR, Estimated: >60 mL/min (ref >60)
Glucose, Bld: 103 mg/dL — ABNORMAL HIGH (ref 70–99)
Potassium: 3 mmol/L — ABNORMAL LOW (ref 3.5–5.1)
Sodium: 138 mmol/L (ref 135–145)
Total Bilirubin: 0.1 mg/dL — ABNORMAL LOW (ref 0.3–1.2)
Total Protein: 7.1 g/dL (ref 6.5–8.1)

## 2022-09-16 LAB — RESP PANEL BY RT-PCR (FLU A&B, COVID) ARPGX2
Influenza A by PCR: NEGATIVE
Influenza B by PCR: NEGATIVE
SARS Coronavirus 2 by RT PCR: NEGATIVE

## 2022-09-16 MED ORDER — POTASSIUM CHLORIDE CRYS ER 20 MEQ PO TBCR
40.0000 meq | EXTENDED_RELEASE_TABLET | Freq: Once | ORAL | Status: AC
Start: 2022-09-16 — End: 2022-09-16
  Administered 2022-09-16: 40 meq via ORAL
  Filled 2022-09-16: qty 2

## 2022-09-16 NOTE — Discharge Instructions (Signed)
You were evaluated in the Emergency Department and after careful evaluation, we did not find any emergent condition requiring admission or further testing in the hospital.  Your exam/testing today is overall reassuring.  Your CT scan was normal.  You tested negative for COVID, flu, and RSV.  Suspect symptoms due to some other virus.  Please return to the Emergency Department if you experience any worsening of your condition.   Thank you for allowing [place] to be a part of your care.

## 2022-09-16 NOTE — ED Notes (Signed)
Patient transported to CT in NAD.

## 2022-09-16 NOTE — ED Provider Notes (Signed)
[facility]PT [facility] Emergency Department Provider Note MRN:  [id number]  Arrival date & time: 09/16/22     Chief Complaint   Epistaxis   History of Present Illness   Debbie Greene is a 36 y.o. year-old female with no pertinent past medical history presenting to the ED with chief complaint of epistaxis.  Nosebleed today and it would not stop.  It has since stopped.  She has been sick lately with nasal congestion, cough, cold-like symptoms.  She explains that she has had issues with nosebleeds for her whole life.  Usually she experiences a headache behind the left eye followed by a nosebleed.  Review of Systems  A thorough review of systems was obtained and all systems are negative except as noted in the HPI and PMH.   Patient's Health History    Past Medical History:  Diagnosis Date   Herpes    High risk sexual behavior 10/01/2016   Tuberculosis     Past Surgical History:  Procedure Laterality Date   THERAPEUTIC ABORTION     Pt states that she has had 2 abortions.     Family History  Problem Relation Age of Onset   Cancer Maternal Grandmother        Breast   Asthma Mother    Hypertension Mother     Social History   Socioeconomic History   Marital status: Single    Spouse name: Not on file   Number of children: Not on file   Years of education: Not on file   Highest education level: Not on file  Occupational History   Not on file  Tobacco Use   Smoking status: Never   Smokeless tobacco: Never  Vaping Use   Vaping Use: Never used  Substance and Sexual Activity   Alcohol use: Not Currently    Comment: Occassionally.    Drug use: No   Sexual activity: Yes    Partners: Male    Birth control/protection: None  Other Topics Concern   Not on file  Social History Narrative   Not on file   Social Determinants of Health   Financial Resource Strain: Not on file  Food Insecurity: Not on file  Transportation Needs: Not on file  Physical  Activity: Not on file  Stress: Not on file  Social Connections: Not on file  Intimate Partner Violence: Not on file     Physical Exam   Vitals:   09/15/22 2343 09/16/22 0045  BP: 109/67 112/86  Pulse: 77 95  Resp: 17   Temp: 98.2 F (36.8 C)   SpO2: 98% 100%    CONSTITUTIONAL: Well-appearing, NAD NEURO/PSYCH:  Alert and oriented x 3, no focal deficits EYES:  eyes equal and reactive ENT/NECK:  no LAD, no JVD CARDIO: Regular rate, well-perfused, normal S1 and S2 PULM:  CTAB no wheezing or rhonchi GI/GU:  non-distended, non-tender MSK/SPINE:  No gross deformities, no edema SKIN:  no rash, atraumatic   *Additional and/or pertinent findings included in MDM below  Diagnostic and Interventional Summary    EKG Interpretation  Date/Time:    Ventricular Rate:    PR Interval:    QRS Duration:   QT Interval:    QTC Calculation:   R Axis:     Text Interpretation:         Labs Reviewed  COMPREHENSIVE METABOLIC PANEL - Abnormal; Notable for the following components:      Result Value   Potassium 3.0 (*)    Glucose, Bld 103 (*)  Calcium 8.8 (*)    Total Bilirubin 0.1 (*)    All other components within normal limits  CBC - Abnormal; Notable for the following components:   RBC 3.56 (*)    Hemoglobin 8.8 (*)    HCT 28.6 (*)    MCH 24.7 (*)    All other components within normal limits  RESP PANEL BY RT-PCR (FLU A&B, COVID) [id number]    CT HEAD WO CONTRAST ( )    (Results Pending)    Medications  potassium chloride SA (KLOR-CON M) CR tablet 40 mEq (has no administration in time range)     Procedures  /  Critical Care Procedures  ED Course and Medical Decision Making  Initial Impression and Ddx Suspect nosebleed related to viral illness.  She has been having frequent headaches preceding the nosebleed, patient interested in brain imaging.  We discussed the pros and cons.  Question intracranial mass or AVM but doubtful.  Patient elects to.  Obtain CT imaging.  Past  medical/surgical history that increases complexity of ED encounter: None  Interpretation of Diagnostics I personally reviewed the laboratory assessment and my interpretation is as follows: No significant blood count or electrolyte disturbance  CT head normal  Patient Reassessment and Ultimate Disposition/Management     Discharge  Patient management required discussion with the following services or consulting groups:  None  Complexity of Problems Addressed Acute complicated illness or Injury  Additional Data Reviewed and Analyzed Further history obtained from: None  Additional Factors Impacting ED Encounter Risk None  Elmer Sow. Pilar Plate, MD [facility] [email or web address]  Final Clinical Impressions(s) / ED Diagnoses     ICD-10-CM   1. Epistaxis  R04.0     2. Viral illness  B34.9     3. Chronic nonintractable headache, unspecified headache type  R51.9    G89.29       ED Discharge Orders     None        Discharge Instructions Discussed with and Provided to Patient:   Discharge Instructions   None      Sabas Sous, MD 09/16/22 [phone]

## 2022-09-20 ENCOUNTER — Ambulatory Visit
Admission: EM | Admit: 2022-09-20 | Discharge: 2022-09-20 | Disposition: A | Discharge status: Home / Self Care | Payer: 59 | Attending: Urgent Care | Admitting: Urgent Care

## 2022-09-20 DIAGNOSIS — J018 Other acute sinusitis: Secondary | ICD-10-CM

## 2022-09-20 MED ORDER — CETIRIZINE HCL 10 MG PO TABS: 10.0000 mg | ORAL_TABLET | Freq: Every day | ORAL | 0 refills | Status: AC

## 2022-09-20 MED ORDER — PROMETHAZINE-DM 6.25-15 MG/5ML PO SYRP: 5.0000 mL | ORAL_SOLUTION | Freq: Three times a day (TID) | ORAL | 0 refills | Status: AC | PRN

## 2022-09-20 MED ORDER — AMOXICILLIN-POT CLAVULANATE 875-125 MG PO TABS: 1.0000 | ORAL_TABLET | Freq: Two times a day (BID) | ORAL | 0 refills | Status: AC

## 2022-09-20 MED ORDER — PSEUDOEPHEDRINE HCL 60 MG PO TABS
60.0000 mg | ORAL_TABLET | Freq: Three times a day (TID) | ORAL | 0 refills | Status: AC | PRN
Start: 2022-09-20 — End: ?

## 2022-09-20 NOTE — ED Triage Notes (Addendum)
Pt c/o URI sx x 8 days-some relief with OTC meds-states she was seen at [facility] ED 11/29 for nose bleeds that have cont'd-states she had neg resp swab-NAD-steady gait

## 2022-09-20 NOTE — ED Provider Notes (Signed)
[facility] - URGENT CARE CENTER  Note:  This document was prepared using Conservation officer, historic buildings and may include unintentional dictation errors.  MRN: [id number] DOB: 1986/06/06  Subjective:   Debbie Greene is a 36 y.o. female presenting for 8 day history of acute onset throat pain, sinus congestion, bilateral ear pain, teeth pain, started to have nosebleeds in the past 2 days.  No chest pain, shortness of breath or wheezing.  Patient was seen on 09/16/2022 and had negative COVID, flu swabs.  No current facility-administered medications for this encounter.  Current Outpatient Medications:    ferrous sulfate 325 (65 FE) MG tablet, Take 1 tablet (325 mg total) by mouth every other day., Disp: 60 tablet, Rfl: 5   ibuprofen (ADVIL) 600 MG tablet, Take 1 tablet (600 mg total) by mouth every 6 (six) hours., Disp: 30 tablet, Rfl: 0   linaclotide (LINZESS) 145 MCG CAPS capsule, Take 1 capsule (145 mcg total) by mouth daily before breakfast., Disp: 30 capsule, Rfl: 0   pantoprazole (PROTONIX) 20 MG tablet, Take 1 tablet (20 mg total) by mouth daily., Disp: 30 tablet, Rfl: 0   pantoprazole (PROTONIX) 20 MG tablet, Take 1 tablet (20 mg total) by mouth 2 (two) times daily before a meal., Disp: 60 tablet, Rfl: 0   Prenat w/o A Vit-FeFum-FePo-FA (CONCEPT OB) 130-92.4-1 MG CAPS, Take 1 capsule by mouth daily., Disp: 30 capsule, Rfl: 11   sucralfate (CARAFATE) 1 g tablet, Take 1 tablet (1 g total) by mouth 4 (four) times daily -  with meals and at bedtime., Disp: 120 tablet, Rfl: 1   valACYclovir (VALTREX) 1000 MG tablet, TAKE 1 TABLET(1000 MG) BY MOUTH DAILY, Disp: 30 tablet, Rfl: 5   No Known Allergies  Past Medical History:  Diagnosis Date   Herpes    High risk sexual behavior 10/01/2016   Tuberculosis      Past Surgical History:  Procedure Laterality Date   THERAPEUTIC ABORTION     Pt states that she has had 2 abortions.     Family History  Problem Relation Age of Onset    Cancer Maternal Grandmother        Breast   Asthma Mother    Hypertension Mother     Social History   Tobacco Use   Smoking status: Never   Smokeless tobacco: Never  Vaping Use   Vaping Use: Never used  Substance Use Topics   Alcohol use: Yes    Comment: occ   Drug use: No    ROS   Objective:   Vitals: BP 108/76 (BP Location: Left Arm)   Pulse 82   Temp 99.3 F (37.4 C) (Oral)   Resp 16   LMP 09/11/2022   SpO2 95%   Physical Exam Constitutional:      General: She is not in acute distress.    Appearance: Normal appearance. She is well-developed and normal weight. She is not ill-appearing, toxic-appearing or diaphoretic.  HENT:     Head: Normocephalic and atraumatic.     Right Ear: Tympanic membrane, ear canal and external ear normal. No drainage or tenderness. No middle ear effusion. There is no impacted cerumen. Tympanic membrane is not erythematous or bulging.     Left Ear: Tympanic membrane, ear canal and external ear normal. No drainage or tenderness.  No middle ear effusion. There is no impacted cerumen. Tympanic membrane is not erythematous or bulging.     Nose: Congestion present. No rhinorrhea.     Comments: Nasal  mucosa erythematous with purulent drainage.    Mouth/Throat:     Mouth: Mucous membranes are moist. No oral lesions.     Pharynx: No pharyngeal swelling, oropharyngeal exudate, posterior oropharyngeal erythema or uvula swelling.     Tonsils: No tonsillar exudate or tonsillar abscesses.  Eyes:     General: No scleral icterus.       Right eye: No discharge.        Left eye: No discharge.     Extraocular Movements: Extraocular movements intact.     Right eye: Normal extraocular motion.     Left eye: Normal extraocular motion.     Conjunctiva/sclera: Conjunctivae normal.  Cardiovascular:     Rate and Rhythm: Normal rate and regular rhythm.     Heart sounds: Normal heart sounds. No murmur heard.    No friction rub. No gallop.  Pulmonary:      Effort: Pulmonary effort is normal. No respiratory distress.     Breath sounds: No stridor. No wheezing, rhonchi or rales.  Chest:     Chest wall: No tenderness.  Musculoskeletal:     Cervical back: Normal range of motion and neck supple.  Lymphadenopathy:     Cervical: No cervical adenopathy.  Skin:    General: Skin is warm and dry.  Neurological:     General: No focal deficit present.     Mental Status: She is alert and oriented to person, place, and time.  Psychiatric:        Mood and Affect: Mood normal.        Behavior: Behavior normal.     Assessment and Plan :   PDMP not reviewed this encounter.  1. Acute non-recurrent sinusitis of other sinus     Will start empiric treatment for sinusitis with Augmentin.  Recommended supportive care otherwise including the use of oral antihistamine, decongestant. Counseled patient on potential for adverse effects with medications prescribed/recommended today, ER and return-to-clinic precautions discussed, patient verbalized understanding.    Wallis Bamberg, PA-C 09/20/22 1610

## 2024-04-20 ENCOUNTER — Encounter (HOSPITAL_COMMUNITY): Payer: Self-pay

## 2024-04-20 ENCOUNTER — Ambulatory Visit (HOSPITAL_COMMUNITY)
Admission: EM | Admit: 2024-04-20 | Discharge: 2024-04-20 | Disposition: A | Discharge status: Home / Self Care | Attending: Internal Medicine | Admitting: Internal Medicine

## 2024-04-20 ENCOUNTER — Ambulatory Visit (INDEPENDENT_AMBULATORY_CARE_PROVIDER_SITE_OTHER)

## 2024-04-20 DIAGNOSIS — M25511 Pain in right shoulder: Secondary | ICD-10-CM

## 2024-04-20 MED ORDER — KETOROLAC TROMETHAMINE 30 MG/ML IJ SOLN
INTRAMUSCULAR | Status: AC
  Filled 2024-04-20: qty 1

## 2024-04-20 MED ORDER — KETOROLAC TROMETHAMINE 30 MG/ML IJ SOLN
30.0000 mg | Freq: Once | INTRAMUSCULAR | Status: AC
  Administered 2024-04-20: 30 mg via INTRAMUSCULAR

## 2024-04-20 NOTE — ED Triage Notes (Signed)
 Patient states she tried to run up an inflatable track thing downtown and slammed her right shoulder into the track.  Patient c/o right shoulder injury.

## 2024-04-20 NOTE — ED Provider Notes (Signed)
 [facility]    CSN: [id number] Arrival date & time: 04/20/24  1725      History   Chief Complaint Chief Complaint  Patient presents with   Shoulder Injury    HPI Debbie Greene is a 38 y.o. female.   38 year old female presents urgent care with complaints of right shoulder pain.  She reports that prior to coming [place] was a lot of inflatable obstacle courses.  She was running and attempting to go up over a wall and fell jamming her shoulder into the wall.  She immediately started having pain in the shoulder and difficulty moving.  It has gotten somewhat better since she has gotten here but the pain is still present with raising her arm up and then bring it down.  She is right-handed.  She denies any previous injury.  She is not having any numbness or tingling.  She denies any elbow or hand pain.   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     OB History     Gravida  6   Para  2   Term  2   Preterm      AB  4   Living  2      SAB      IAB  4   Ectopic      Multiple  0   Live Births  2            Home Medications    Prior to Admission medications   Medication Sig Start Date End Date Taking?  Authorizing Provider  amoxicillin -clavulanate (AUGMENTIN ) 875-125 MG tablet Take 1 tablet by mouth 2 (two) times daily. 09/20/22   Christopher Savannah, PA-C  cetirizine  (ZYRTEC  ALLERGY) 10 MG tablet Take 1 tablet (10 mg total) by mouth daily. 09/20/22   Christopher Savannah, PA-C  ferrous sulfate  325 (65 FE) MG tablet Take 1 tablet (325 mg total) by mouth every other day. 07/30/20   Leftwich-Kirby, Olam LABOR, CNM  ibuprofen  (ADVIL ) 600 MG tablet Take 1 tablet (600 mg total) by mouth every 6 (six) hours. 10/14/20   [place] , Jade, FNP  linaclotide  (LINZESS ) 145 MCG CAPS capsule Take 1 capsule (145 mcg total) by mouth daily before breakfast. 11/06/21 12/06/21  Joesph Shaver Scales, PA-C  pantoprazole  (PROTONIX ) 20 MG tablet Take 1 tablet (20 mg total) by mouth daily. 12/12/21   Lenor Hollering, MD  pantoprazole  (PROTONIX ) 20 MG tablet Take 1 tablet (20 mg total) by mouth 2 (two) times daily before a meal. 12/13/21   Geroldine Berg, MD  Prenat w/o A Vit-FeFum-FePo-FA (CONCEPT OB ) 130-92.4-1 MG CAPS Take 1 capsule by mouth daily. 07/30/20   Leftwich-Kirby, Olam LABOR, CNM  promethazine -dextromethorphan (PROMETHAZINE -DM)  6.25-15 MG/5ML syrup Take 5 mLs by mouth 3 (three) times daily as needed for cough. 09/20/22   Christopher Savannah, PA-C  pseudoephedrine  (SUDAFED) 60 MG tablet Take 1 tablet (60 mg total) by mouth every 8 (eight) hours as needed for congestion. 09/20/22   Christopher Savannah, PA-C  sucralfate  (CARAFATE ) 1 g tablet Take 1 tablet (1 g total) by mouth 4 (four) times daily -  with meals and at bedtime. 12/13/21   Geroldine Berg, MD  valACYclovir  (VALTREX ) 1000 MG tablet TAKE 1 TABLET(1000 MG) BY MOUTH DAILY 03/16/22   Constant, Winton, MD    Family History Family History  Problem Relation Age of Onset   Cancer Maternal Grandmother        Breast   Asthma Mother    Hypertension Mother     Social History Social History   Tobacco Use   Smoking status: Never   Smokeless tobacco: Never  Vaping Use   Vaping status: Never Used   Substance Use Topics   Alcohol use: Yes    Comment: occ   Drug use: No     Allergies   Patient has no known allergies.   Review of Systems Review of Systems   Physical Exam Triage Vital Signs ED Triage Vitals [04/20/24 1736]  Encounter Vitals Group     BP 93/69     Girls Systolic BP Percentile      Girls Diastolic BP Percentile      Boys Systolic BP Percentile      Boys Diastolic BP Percentile      Pulse Rate 88     Resp 16     Temp 98.8 F (37.1 C)     Temp Source Oral     SpO2 96 %     Weight      Height      Head Circumference      Peak Flow      Pain Score 10     Pain Loc      Pain Education      Exclude from Growth Chart    No data found.  Updated Vital Signs BP 93/69 (BP Location: Left Arm)   Pulse 88   Temp 98.8 F (37.1 C) (Oral)   Resp 16   SpO2 96%   Visual Acuity Right Eye Distance:   Left Eye Distance:   Bilateral Distance:    Right Eye Near:   Left Eye Near:    Bilateral Near:     Physical Exam Vitals and nursing note reviewed.  Constitutional:      General: She is not in acute distress.    Appearance: She is well-developed.  HENT:     Head: Normocephalic and atraumatic.  Eyes:     Conjunctiva/sclera: Conjunctivae normal.  Cardiovascular:     Rate and Rhythm: Normal rate and regular rhythm.     Heart sounds: No murmur heard. Pulmonary:     Effort: Pulmonary effort is normal. No respiratory distress.     Breath sounds: Normal breath sounds.  Abdominal:     Palpations: Abdomen is soft.     Tenderness: There is no abdominal tenderness.  Musculoskeletal:        General: No swelling.     Right shoulder: Tenderness (anterior at the [facility] joint) and bony tenderness present. No swelling or deformity. Decreased range of motion (especially with adduction and abduction). Normal strength. Normal pulse.     Right upper arm: Normal.     Right elbow: Normal.  Right wrist: Normal pulse.     Cervical back: Neck supple.  Skin:     General: Skin is warm and dry.     Capillary Refill: Capillary refill takes less than 2 seconds.  Neurological:     Mental Status: She is alert.  Psychiatric:        Mood and Affect: Mood normal.      UC Treatments / Results  Labs (all labs ordered are listed, but only abnormal results are displayed) Labs Reviewed - No data to display  EKG   Radiology DG Clavicle Right Result Date: 04/20/2024 CLINICAL DATA:  Shoulder pain EXAM: RIGHT CLAVICLE - 2+ VIEWS COMPARISON:  None Available. FINDINGS: There is no evidence of fracture or other focal bone lesions. Soft tissues are unremarkable. Probable nutrient foramen at the superior cortex of mid clavicle. IMPRESSION: Negative. Electronically Signed   By: Luke Bun M.D.   On: 04/20/2024 18:36   DG Shoulder Right Result Date: 04/20/2024 CLINICAL DATA:  Right shoulder pain EXAM: RIGHT SHOULDER - 2+ VIEW COMPARISON:  None Available. FINDINGS: There is no evidence of fracture or dislocation. There is no evidence of arthropathy or other focal bone abnormality. Soft tissues are unremarkable. IMPRESSION: Negative. Electronically Signed   By: Luke Bun M.D.   On: 04/20/2024 18:35    Procedures Procedures (including critical care time)  Medications Ordered in UC Medications  ketorolac  (TORADOL ) 30 MG/ML injection 30 mg (30 mg Intramuscular Given 04/20/24 1857)    Initial Impression / Assessment and Plan / UC Course  I have reviewed the triage vital signs and the nursing notes.  Pertinent labs & imaging results that were available during my care of the patient were reviewed by me and considered in my medical decision making (see chart for details).     Acute pain of right shoulder - Plan: DG Shoulder Right, DG Clavicle Right, DG Shoulder Right, DG Clavicle Right   X-ray done today.  Final evaluation by the radiologist is negative for acute fractures.  This does not completely rule out a ligament or tendon injury although this is less  likely given the physical exam and x-ray findings.  At this time recommend using a sling during the day to support the shoulder and remove at night and over the next several days increase activity as tolerated.  If able to increase without problems then continue to increase.  If there is significant pain or decreased range of motion then recommend following up with orthopedics. Toradol  injection given today. This is a medication to help with pain. This is not a narcotic.   May alternate tylenol  and ibuprofen  for pain.  Ice the area 2-3 times daily for 10-15 minutes to help with pain and swelling. Do not apply ice directly to the skin.  Return to urgent care or PCP if symptoms worsen or fail to resolve.    Final Clinical Impressions(s) / UC Diagnoses   Final diagnoses:  Acute pain of right shoulder     Discharge Instructions      X-ray done today.  Final evaluation by the radiologist is negative for acute fractures.  This does not completely rule out a ligament or tendon injury although this is less likely given the physical exam and x-ray findings.  At this time recommend using a sling during the day to support the shoulder and remove at night and over the next several days increase activity as tolerated.  If able to increase without problems then continue to increase.  If there is significant pain or decreased range of motion then recommend following up with orthopedics. Toradol  injection given today. This is a medication to help with pain. This is not a narcotic.   May alternate tylenol  and ibuprofen  for pain.  Ice the area 2-3 times daily for 10-15 minutes to help with pain and swelling. Do not apply ice directly to the skin.  Return to urgent care or PCP if symptoms worsen or fail to resolve.       ED Prescriptions   None    PDMP not reviewed this encounter.   Teresa Almarie LABOR, [place] 04/20/24 1858

## 2024-04-20 NOTE — Discharge Instructions (Addendum)
 X-ray done today.  Final evaluation by the radiologist is negative for acute fractures.  This does not completely rule out a ligament or tendon injury although this is less likely given the physical exam and x-ray findings.  At this time recommend using a sling during the day to support the shoulder and remove at night and over the next several days increase activity as tolerated.  If able to increase without problems then continue to increase.  If there is significant pain or decreased range of motion then recommend following up with orthopedics. Toradol  injection given today. This is a medication to help with pain. This is not a narcotic.   May alternate tylenol  and ibuprofen  for pain.  Ice the area 2-3 times daily for 10-15 minutes to help with pain and swelling. Do not apply ice directly to the skin.  Return to urgent care or PCP if symptoms worsen or fail to resolve.

## 2024-05-09 ENCOUNTER — Ambulatory Visit: Admitting: Obstetrics and Gynecology
